# Patient Record
Sex: Female | Born: 1985 | Race: Black or African American | Hispanic: No | Marital: Married | Smoking: Never smoker
Health system: Southern US, Community
[De-identification: ages and names within clinical notes are randomized; demographics above are authoritative.]

## PROBLEM LIST (undated history)

## (undated) ENCOUNTER — Inpatient Hospital Stay (HOSPITAL_COMMUNITY): Payer: Self-pay

## (undated) DIAGNOSIS — Z789 Other specified health status: Secondary | ICD-10-CM

## (undated) DIAGNOSIS — O139 Gestational [pregnancy-induced] hypertension without significant proteinuria, unspecified trimester: Secondary | ICD-10-CM

## (undated) HISTORY — PX: NO PAST SURGERIES: SHX2092

## (undated) HISTORY — DX: Gestational (pregnancy-induced) hypertension without significant proteinuria, unspecified trimester: O13.9

---
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
## 2011-10-17 LAB — OB RESULTS CONSOLE GC/CHLAMYDIA: Gonorrhea: NEGATIVE

## 2011-10-17 LAB — OB RESULTS CONSOLE ABO/RH

## 2011-10-17 LAB — OB RESULTS CONSOLE HIV ANTIBODY (ROUTINE TESTING): HIV: NONREACTIVE

## 2011-10-17 LAB — OB RESULTS CONSOLE RPR: RPR: NONREACTIVE

## 2011-12-23 NOTE — L&D Delivery Note (Signed)
Delivery Note Pt progressed well on Pitocin and reached complete dilation at 2015pm.  She was noted to have some elevated BP in labor, but her PIH labs were normal and she had no proteinuria.  She developed a low grade temp of 99 at delivery.  At 9:16 PM a healthy female was delivered via Vaginal, Spontaneous Delivery (Presentation:ROA).  APGAR: 8, 9; weight pending .   Placenta status: Intact, Spontaneous.  Cord:  with the following complications: none .   Anesthesia: Epidural  Episiotomy: n/a Lacerations: none Suture Repair: n/a Est. Blood Loss (mL): 300cc  Mom to postpartum.  Baby to nursery-stable.  Oliver Pila 05/08/2012, 9:29 PM

## 2012-03-14 ENCOUNTER — Encounter (HOSPITAL_COMMUNITY): Payer: Self-pay | Admitting: Obstetrics and Gynecology

## 2012-03-14 ENCOUNTER — Inpatient Hospital Stay (HOSPITAL_COMMUNITY)
Admission: AD | Admit: 2012-03-14 | Discharge: 2012-03-14 | Disposition: A | Discharge status: Home / Self Care | Payer: Self-pay | Source: Ambulatory Visit | Attending: Obstetrics and Gynecology | Admitting: Obstetrics and Gynecology

## 2012-03-14 DIAGNOSIS — Z331 Pregnant state, incidental: Secondary | ICD-10-CM

## 2012-03-14 DIAGNOSIS — O99891 Other specified diseases and conditions complicating pregnancy: Secondary | ICD-10-CM | POA: Insufficient documentation

## 2012-03-14 DIAGNOSIS — O99340 Other mental disorders complicating pregnancy, unspecified trimester: Secondary | ICD-10-CM

## 2012-03-14 DIAGNOSIS — Z0000 Encounter for general adult medical examination without abnormal findings: Secondary | ICD-10-CM

## 2012-03-14 HISTORY — DX: Other specified health status: Z78.9

## 2012-03-14 LAB — CBC
HCT: 35.5 % — ABNORMAL LOW (ref 36.0–46.0)
Hemoglobin: 11.7 g/dL — ABNORMAL LOW (ref 12.0–15.0)
RBC: 4.08 MIL/uL (ref 3.87–5.11)
WBC: 11.7 10*3/uL — ABNORMAL HIGH (ref 4.0–10.5)

## 2012-03-14 LAB — URINALYSIS, ROUTINE W REFLEX MICROSCOPIC
Bilirubin Urine: NEGATIVE
Glucose, UA: NEGATIVE mg/dL
Ketones, ur: NEGATIVE mg/dL
pH: 7 (ref 5.0–8.0)

## 2012-03-14 LAB — URINE MICROSCOPIC-ADD ON

## 2012-03-14 NOTE — MAU Provider Note (Signed)
Chief Complaint:  Illness    First Provider Initiated Contact with Patient 03/14/12 1311      Joanne Gross is  26 y.o. G1P0000.  No LMP recorded. Patient is pregnant..  [redacted]w[redacted]d   She presents complaining of Illness . Onset is described as gradual and has been present for  3 days. Pt presents with vague complaint of "not feeling well" reports awoke this morning tired and not wanting to get out of bed. Denies abd pain, dysuria, nausea, vomiting, diarrhea, fever, chills, blding, LOF or URI s/s. Reports nothing to eat or drink today. +FM.   Obstetrical/Gynecological History: OB History    Grav Para Term Preterm Abortions TAB SAB Ect Mult Living   1 0 0 0 0 0 0 0 0 0       Past Medical History: Past Medical History  Diagnosis Date  . No pertinent past medical history     Past Surgical History: Past Surgical History  Procedure Date  . No past surgeries     Family History: No family history on file.  Social History: History  Substance Use Topics  . Smoking status: Never Smoker   . Smokeless tobacco: Not on file  . Alcohol Use: No    Allergies: No Known Allergies  No prescriptions prior to admission    Review of Systems - Negative except what has been reviewed in the HPI Psychological ROS: positive for - sleep disturbances negative for - behavioral disorder, obsessive thoughts or suicidal ideation  Physical Exam   Blood pressure 108/63, pulse 82, temperature 98.5 F (36.9 C), temperature source Oral, resp. rate 16.  General: General appearance - alert, well appearing, and in no distress, oriented to person, place, and time, overweight and laying on side, will not make eye contact Mental status - alert, oriented to person, place, and time, depressed mood, flat affect Abdomen - gravid, non tender FHT: 145, mod variability, + 15x15 accels, no decels, Cat I tracing TOco: Non contractions Focused Gynecological Exam: examination not indicated  Labs: Recent Results  (from the past 24 hour(s))  URINALYSIS, ROUTINE W REFLEX MICROSCOPIC   Collection Time   03/14/12 11:50 AM      Component Value Range   Color, Urine YELLOW  YELLOW    APPearance HAZY (*) CLEAR    Specific Gravity, Urine <1.005 (*) 1.005 - 1.030    pH 7.0  5.0 - 8.0    Glucose, UA NEGATIVE  NEGATIVE (mg/dL)   Hgb urine dipstick NEGATIVE  NEGATIVE    Bilirubin Urine NEGATIVE  NEGATIVE    Ketones, ur NEGATIVE  NEGATIVE (mg/dL)   Protein, ur NEGATIVE  NEGATIVE (mg/dL)   Urobilinogen, UA 0.2  0.0 - 1.0 (mg/dL)   Nitrite NEGATIVE  NEGATIVE    Leukocytes, UA SMALL (*) NEGATIVE   URINE MICROSCOPIC-ADD ON   Collection Time   03/14/12 11:50 AM      Component Value Range   Squamous Epithelial / LPF FEW (*) RARE    WBC, UA 7-10  <3 (WBC/hpf)   Bacteria, UA FEW (*) RARE   CBC   Collection Time   03/14/12  1:18 PM      Component Value Range   WBC 11.7 (*) 4.0 - 10.5 (K/uL)   RBC 4.08  3.87 - 5.11 (MIL/uL)   Hemoglobin 11.7 (*) 12.0 - 15.0 (g/dL)   HCT 16.1 (*) 09.6 - 46.0 (%)   MCV 87.0  78.0 - 100.0 (fL)   MCH 28.7  26.0 - 34.0 (pg)  MCHC 33.0  30.0 - 36.0 (g/dL)   RDW 40.9  81.1 - 91.4 (%)   Platelets 233  150 - 400 (K/uL)  GLUCOSE, CAPILLARY   Collection Time   03/14/12  1:20 PM      Component Value Range   Glucose-Capillary 74  70 - 99 (mg/dL)   MD Consult: Discussed with Dr. Senaida Ores  Assessment: Normal Exam at [redacted]w[redacted]d ? Depression  Plan: Discharge home Encouraged to eat and drink regularly  FU in office as scheduled or call for sooner appt if symptoms persist.  Livingston Denner E. 03/14/2012,1:47 PM

## 2012-03-14 NOTE — Discharge Instructions (Signed)
Suicidal Feelings, How to Help Yourself  Everyone feels sad or unhappy at times, but depressing thoughts and feelings of hopelessness can lead to thoughts of suicide. It can seem as if life is too tough to handle. It is as if the mountain is just too high and your climbing skills are not great enough. At that moment these dark thoughts and feelings may seem overwhelming and never ending. It is important to remember these feelings are temporary! They will go away. If you feel as though you have reached the point where suicide is the only answer, it is time to let someone know immediately. This is the first step to feeling better. The following steps will move you to safer ground and lead you in a positive direction out of depression.  HOW TO COPE AND PREVENT SUICIDE   Let family, friends, teachers and/or counselors know. Get help. Try not to isolate yourself from those who care about you. Even though you may not feel sociable or think that you are not good company, talk with someone everyday. It is best if it is face to face. Remember, they will want to help you.   Eat a regularly spaced and well-balanced diet, and get plenty of rest.   Avoid alcohol and drugs because they will only make you feel worse and may also lower your inhibitions. Remove them from the home. If you are thinking of taking an overdose of your prescribed medications, give your medicines to someone who can give them to you one day at a time. If you are on antidepressants, let your caregiver know of your feelings so he or she can provide a safer medication, if that is a concern.   Remove weapons or poisons from your home.   Try to stick to routines. That may mean just walking the dog or feeding the cat. Follow a schedule and remind yourself that you have to keep that schedule every day. Play with your pets. If it is possible, and you do not have a pet, get one. They give you a sense of well-being, lower your blood pressure and make your heart  feel good. They need you, and we all want to be needed.   Set some realistic goals and achieve them. Make a list and cross things off as you go. Accomplishments give a sense of worth. Wait until you are feeling better before doing things you find difficult or unpleasant to do.   If you are able, try to start exercising. Even half-hour periods of exercise each day will make you feel better. Getting out in the sun or into nature helps you recover from depression faster. If you have a favorite place to walk, take advantage of that.   Increase safe activities that have always given you pleasure. This may include playing your favorite music, reading a good book, painting a picture or playing your favorite instrument. Do whatever takes your mind off your depression and puts a smile on your face.   Keep your living space well lit with windows open, and let the sun shine in. Bright light definitely treats depression, not just people with the seasonal affective disorders (SAD).  Above all else remember, depression is temporary. It will go away. Do not contemplate suicide. Death as a permanent solution is not the answer. Suicide will take away the beautiful rest of life, and do lifelong harm to those around you who love you. Help is available.  National Suicide Help Lines with 24 hour help   are:  1-800-SUICIDE  1-800-784-2433  Document Released: 06/14/2003 Document Revised: 11/27/2011 Document Reviewed: 11/02/2007  ExitCare Patient Information 2012 [employer]  Depression, Adolescent and Adult  Depression is a true and treatable medical condition. In general there are two kinds of depression:   Depression we all experience in some form. For example depression from the death of a loved one, financial distress or natural disasters will trigger or increase depression.   Clinical depression, on the other hand, appears without an apparent cause or reason. This depression is a disease. Depression may be caused by chemical  imbalance in the body and brain or may come as a response to a physical illness. Alcohol and other drugs can cause depression.  DIAGNOSIS   The diagnosis of depression is usually based upon symptoms and medical history.  TREATMENT   Treatments for depression fall into three categories. These are:   Drug therapy. There are many medicines that treat depression. Responses may vary and sometimes trial and error is necessary to determine the best medicines and dosage for a particular patient.   Psychotherapy, also called talking treatments, helps people resolve their problems by looking at them from a different point of view and by giving people insight into their own personal makeup. Traditional psychotherapy looks at a childhood source of a problem. Other psychotherapy will look at current conflicts and move toward solving those. If the cause of depression is drug use, counseling is available to help abstain. In time the depression will usually improve. If there were underlying causes for the chemical use, they can be addressed.   ECT (electroconvulsive therapy) or shock treatment is not as commonly used today. It is a very effective treatment for severe suicidal depression. During ECT electrical impulses are applied to the head. These impulses cause a generalized seizure. It can be effective but causes a loss of memory for recent events. Sometimes this loss of memory may include the last several months.  Treat all depression or suicide threats as serious. Obtain professional help. Do not wait to see if serious depression will get better over time without help. Seek help for yourself or those around you.  In the [place] the number to the National Suicide Help Lines With 24 Hour Help Are:  1-800-SUICIDE  1-800-784-2433  Document Released: 12/05/2000 Document Revised: 11/27/2011 Document Reviewed: 07/26/2008  [employer] Patient Information 2012 [employer].

## 2012-04-19 LAB — OB RESULTS CONSOLE GBS: GBS: NEGATIVE

## 2012-05-03 ENCOUNTER — Telehealth (HOSPITAL_COMMUNITY): Payer: Self-pay | Admitting: *Deleted

## 2012-05-03 ENCOUNTER — Encounter (HOSPITAL_COMMUNITY): Payer: Self-pay | Admitting: *Deleted

## 2012-05-03 NOTE — Telephone Encounter (Signed)
Preadmission screen  

## 2012-05-04 ENCOUNTER — Encounter (HOSPITAL_COMMUNITY): Payer: Self-pay | Admitting: *Deleted

## 2012-05-04 ENCOUNTER — Telehealth (HOSPITAL_COMMUNITY): Payer: Self-pay | Admitting: *Deleted

## 2012-05-04 NOTE — Telephone Encounter (Signed)
Preadmission screen  

## 2012-05-08 ENCOUNTER — Inpatient Hospital Stay (HOSPITAL_COMMUNITY)
Admission: AD | Admit: 2012-05-08 | Discharge: 2012-05-10 | DRG: 774 | Disposition: A | Discharge status: Home / Self Care | Payer: No Typology Code available for payment source | Attending: Obstetrics and Gynecology | Admitting: Obstetrics and Gynecology

## 2012-05-08 ENCOUNTER — Encounter (HOSPITAL_COMMUNITY): Payer: Self-pay | Admitting: *Deleted

## 2012-05-08 ENCOUNTER — Inpatient Hospital Stay (HOSPITAL_COMMUNITY): Payer: No Typology Code available for payment source | Admitting: Anesthesiology

## 2012-05-08 ENCOUNTER — Encounter (HOSPITAL_COMMUNITY): Payer: Self-pay | Admitting: Anesthesiology

## 2012-05-08 DIAGNOSIS — O864 Pyrexia of unknown origin following delivery: Principal | ICD-10-CM | POA: Diagnosis not present

## 2012-05-08 LAB — COMPREHENSIVE METABOLIC PANEL
ALT: 17 U/L (ref 0–35)
AST: 23 U/L (ref 0–37)
Calcium: 9.6 mg/dL (ref 8.4–10.5)
GFR calc Af Amer: >90 mL/min (ref >90)
Glucose, Bld: 77 mg/dL (ref 70–99)
Sodium: 133 mEq/L — ABNORMAL LOW (ref 135–145)
Total Protein: 7 g/dL (ref 6.0–8.3)

## 2012-05-08 LAB — URINALYSIS, ROUTINE W REFLEX MICROSCOPIC
Bilirubin Urine: NEGATIVE
Leukocytes, UA: NEGATIVE
Nitrite: NEGATIVE
Specific Gravity, Urine: <1.005 — ABNORMAL LOW (ref 1.005–1.030)
pH: 6 (ref 5.0–8.0)

## 2012-05-08 LAB — CBC
MCH: 28.4 pg (ref 26.0–34.0)
MCHC: 32.8 g/dL (ref 30.0–36.0)
Platelets: 251 10*3/uL (ref 150–400)
RBC: 4.83 MIL/uL (ref 3.87–5.11)
RDW: 14.2 % (ref 11.5–15.5)

## 2012-05-08 LAB — RPR: RPR Ser Ql: NONREACTIVE

## 2012-05-08 MED ORDER — PHENYLEPHRINE 40 MCG/ML (10ML) SYRINGE FOR IV PUSH (FOR BLOOD PRESSURE SUPPORT): 80.0000 ug | PREFILLED_SYRINGE | INTRAVENOUS | Status: DC | PRN

## 2012-05-08 MED ORDER — FENTANYL 2.5 MCG/ML BUPIVACAINE 1/10 % EPIDURAL INFUSION (WH - ANES)
INTRAMUSCULAR | Status: DC | PRN
  Administered 2012-05-08: 14 mL/h via EPIDURAL

## 2012-05-08 MED ORDER — DIPHENHYDRAMINE HCL 50 MG/ML IJ SOLN: 12.5000 mg | INTRAMUSCULAR | Status: DC | PRN

## 2012-05-08 MED ORDER — EPHEDRINE 5 MG/ML INJ
10.0000 mg | INTRAVENOUS | Status: DC | PRN
  Filled 2012-05-08: qty 4

## 2012-05-08 MED ORDER — OXYCODONE-ACETAMINOPHEN 5-325 MG PO TABS: 1.0000 | ORAL_TABLET | ORAL | Status: DC | PRN

## 2012-05-08 MED ORDER — EPHEDRINE 5 MG/ML INJ: 10.0000 mg | INTRAVENOUS | Status: DC | PRN

## 2012-05-08 MED ORDER — OXYTOCIN BOLUS FROM INFUSION
500.0000 mL | Freq: Once | INTRAVENOUS | Status: AC
  Administered 2012-05-08: 500 mL via INTRAVENOUS
  Filled 2012-05-08: qty 500

## 2012-05-08 MED ORDER — ACETAMINOPHEN 325 MG PO TABS: 650.0000 mg | ORAL_TABLET | ORAL | Status: DC | PRN

## 2012-05-08 MED ORDER — FLEET ENEMA 7-19 GM/118ML RE ENEM: 1.0000 | ENEMA | RECTAL | Status: DC | PRN

## 2012-05-08 MED ORDER — LIDOCAINE HCL (PF) 1 % IJ SOLN
30.0000 mL | INTRAMUSCULAR | Status: DC | PRN
  Filled 2012-05-08: qty 30

## 2012-05-08 MED ORDER — IBUPROFEN 600 MG PO TABS
600.0000 mg | ORAL_TABLET | Freq: Four times a day (QID) | ORAL | Status: DC | PRN
  Administered 2012-05-08: 600 mg via ORAL
  Filled 2012-05-08: qty 1

## 2012-05-08 MED ORDER — BUTORPHANOL TARTRATE 2 MG/ML IJ SOLN: 1.0000 mg | INTRAMUSCULAR | Status: DC | PRN

## 2012-05-08 MED ORDER — CITRIC ACID-SODIUM CITRATE 334-500 MG/5ML PO SOLN: 30.0000 mL | ORAL | Status: DC | PRN

## 2012-05-08 MED ORDER — ONDANSETRON HCL 4 MG/2ML IJ SOLN: 4.0000 mg | Freq: Four times a day (QID) | INTRAMUSCULAR | Status: DC | PRN

## 2012-05-08 MED ORDER — LACTATED RINGERS IV SOLN: 500.0000 mL | INTRAVENOUS | Status: DC | PRN

## 2012-05-08 MED ORDER — OXYTOCIN 20 UNITS IN LACTATED RINGERS INFUSION - SIMPLE
125.0000 mL/h | Freq: Once | INTRAVENOUS | Status: DC
  Filled 2012-05-08: qty 1000

## 2012-05-08 MED ORDER — OXYTOCIN 20 UNITS IN LACTATED RINGERS INFUSION - SIMPLE
1.0000 m[IU]/min | INTRAVENOUS | Status: DC
  Administered 2012-05-08: 2 m[IU]/min via INTRAVENOUS

## 2012-05-08 MED ORDER — FENTANYL 2.5 MCG/ML BUPIVACAINE 1/10 % EPIDURAL INFUSION (WH - ANES)
14.0000 mL/h | INTRAMUSCULAR | Status: DC
  Administered 2012-05-08 (×2): 14 mL/h via EPIDURAL
  Filled 2012-05-08 (×4): qty 60

## 2012-05-08 MED ORDER — LACTATED RINGERS IV SOLN
INTRAVENOUS | Status: DC
  Administered 2012-05-08 (×3): via INTRAVENOUS

## 2012-05-08 MED ORDER — SODIUM BICARBONATE 8.4 % IV SOLN
INTRAVENOUS | Status: DC | PRN
  Administered 2012-05-08: 4 mL via EPIDURAL

## 2012-05-08 MED ORDER — TERBUTALINE SULFATE 1 MG/ML IJ SOLN: 0.2500 mg | Freq: Once | INTRAMUSCULAR | Status: DC | PRN

## 2012-05-08 MED ORDER — PHENYLEPHRINE 40 MCG/ML (10ML) SYRINGE FOR IV PUSH (FOR BLOOD PRESSURE SUPPORT)
80.0000 ug | PREFILLED_SYRINGE | INTRAVENOUS | Status: DC | PRN
  Filled 2012-05-08: qty 5

## 2012-05-08 MED ORDER — LACTATED RINGERS IV SOLN: 500.0000 mL | Freq: Once | INTRAVENOUS | Status: DC

## 2012-05-08 NOTE — Anesthesia Procedure Notes (Signed)

## 2012-05-08 NOTE — H&P (Signed)
Joanne Gross is a 26 y.o. female G1P0 at 39+ weeks presented to MAU delete with contractions and found to be 3cm dilated, walked and rechecked with change to 4cm and pain 8/10.  Admitted for pain management and will augment as needed.  Her prenatal course has been complicated only by several missed appts from 10-24 weeks but was consistent after that.  Had an abnormal one hour GTT and three hour had one abnormal value, pt declined repeat test at 32 weeks.  Blood type oneg, received rhogam.  Maternal Medical History:  Reason for admission: Reason for admission: contractions.  Contractions: Onset was 3-5 hours ago.   Frequency: regular.   Perceived severity is moderate.    Fetal activity: Perceived fetal activity is normal.      OB History    Grav Para Term Preterm Abortions TAB SAB Ect Mult Living   1 0 0 0 0 0 0 0 0 0      Past Medical History  Diagnosis Date  . No pertinent past medical history    Past Surgical History  Procedure Date  . No past surgeries    Family History: family history includes Diabetes in her maternal aunt and Hypertension in her mother. Social History:  reports that she has never smoked. She has never used smokeless tobacco. She reports that she does not drink alcohol or use illicit drugs.  ROS  Blood pressure 133/91, temperature 99.3 F (37.4 C), temperature source Oral, resp. rate 18, height 5\' 4"  (1.626 m), weight 80.377 kg (177 lb 3.2 oz). Exam Physical Exam  Constitutional: She is oriented to person, place, and time. She appears well-developed and well-nourished.  Cardiovascular: Normal rate and regular rhythm.   Respiratory: Effort normal and breath sounds normal.  GI: Soft. Bowel sounds are normal.  Genitourinary: Vagina normal and uterus normal.  Musculoskeletal: Normal range of motion.  Neurological: She is alert and oriented to person, place, and time.  Psychiatric: She has a normal mood and affect. Her behavior is normal.    Cervix 80/4/-1 AROM clear  Prenatal labs: ABO, Rh: O/Negative/-- (10/26 0000) Antibody: Negative (10/26 0000) Rubella: Immune (10/26 0000) RPR: Nonreactive (10/26 0000)  HBsAg: Negative (10/26 0000)  HIV: Non-reactive (10/26 0000)  GBS: Negative (04/29 0000)  One hour GCT 147 Three hour GTT 97/131/140/119 First trimester screen WNL, missed AFP Hgb AA  Assessment/Plan: Pt admitted and will now be augmented with pitocin as needed.  GBS negative.  Oliver Pila 05/08/2012, 11:10 AM

## 2012-05-08 NOTE — MAU Note (Signed)
Pt reports having ctx since 7am about 5 min. Reports having some bloody show repots not feeling baby move much this morning.

## 2012-05-08 NOTE — Anesthesia Preprocedure Evaluation (Signed)

## 2012-05-09 LAB — CBC
HCT: 35.8 % — ABNORMAL LOW (ref 36.0–46.0)
Hemoglobin: 11.7 g/dL — ABNORMAL LOW (ref 12.0–15.0)
MCH: 28.4 pg (ref 26.0–34.0)
MCV: 86.9 fL (ref 78.0–100.0)
Platelets: 245 10*3/uL (ref 150–400)
RBC: 4.12 MIL/uL (ref 3.87–5.11)

## 2012-05-09 MED ORDER — ONDANSETRON HCL 4 MG/2ML IJ SOLN: 4.0000 mg | INTRAMUSCULAR | Status: DC | PRN

## 2012-05-09 MED ORDER — OXYCODONE-ACETAMINOPHEN 5-325 MG PO TABS
1.0000 | ORAL_TABLET | ORAL | Status: DC | PRN
  Administered 2012-05-09: 1 via ORAL
  Filled 2012-05-09: qty 1

## 2012-05-09 MED ORDER — DIBUCAINE 1 % RE OINT: 1.0000 "application " | TOPICAL_OINTMENT | RECTAL | Status: DC | PRN

## 2012-05-09 MED ORDER — RHO D IMMUNE GLOBULIN 1500 UNIT/2ML IJ SOLN
300.0000 ug | Freq: Once | INTRAMUSCULAR | Status: AC
  Administered 2012-05-09: 300 ug via INTRAVENOUS
  Filled 2012-05-09: qty 2

## 2012-05-09 MED ORDER — BENZOCAINE-MENTHOL 20-0.5 % EX AERO
1.0000 "application " | INHALATION_SPRAY | CUTANEOUS | Status: DC | PRN
  Administered 2012-05-09: 1 via TOPICAL
  Filled 2012-05-09: qty 56

## 2012-05-09 MED ORDER — LANOLIN HYDROUS EX OINT: TOPICAL_OINTMENT | CUTANEOUS | Status: DC | PRN

## 2012-05-09 MED ORDER — SIMETHICONE 80 MG PO CHEW: 80.0000 mg | CHEWABLE_TABLET | ORAL | Status: DC | PRN

## 2012-05-09 MED ORDER — TETANUS-DIPHTH-ACELL PERTUSSIS 5-2.5-18.5 LF-MCG/0.5 IM SUSP
0.5000 mL | Freq: Once | INTRAMUSCULAR | Status: AC
  Administered 2012-05-09: 0.5 mL via INTRAMUSCULAR
  Filled 2012-05-09: qty 0.5

## 2012-05-09 MED ORDER — DIPHENHYDRAMINE HCL 25 MG PO CAPS: 25.0000 mg | ORAL_CAPSULE | Freq: Four times a day (QID) | ORAL | Status: DC | PRN

## 2012-05-09 MED ORDER — IBUPROFEN 600 MG PO TABS
600.0000 mg | ORAL_TABLET | Freq: Four times a day (QID) | ORAL | Status: DC
  Administered 2012-05-09 – 2012-05-10 (×5): 600 mg via ORAL
  Filled 2012-05-09 (×6): qty 1

## 2012-05-09 MED ORDER — SENNOSIDES-DOCUSATE SODIUM 8.6-50 MG PO TABS: 2.0000 | ORAL_TABLET | Freq: Every day | ORAL | Status: DC

## 2012-05-09 MED ORDER — WITCH HAZEL-GLYCERIN EX PADS: 1.0000 "application " | MEDICATED_PAD | CUTANEOUS | Status: DC | PRN

## 2012-05-09 MED ORDER — ONDANSETRON HCL 4 MG PO TABS: 4.0000 mg | ORAL_TABLET | ORAL | Status: DC | PRN

## 2012-05-09 MED ORDER — ZOLPIDEM TARTRATE 5 MG PO TABS: 5.0000 mg | ORAL_TABLET | Freq: Every evening | ORAL | Status: DC | PRN

## 2012-05-09 MED ORDER — PRENATAL MULTIVITAMIN CH
1.0000 | ORAL_TABLET | Freq: Every day | ORAL | Status: DC
  Administered 2012-05-09 – 2012-05-10 (×2): 1 via ORAL
  Filled 2012-05-09 (×2): qty 1

## 2012-05-09 NOTE — Progress Notes (Signed)
Post Partum Day 1 Subjective: no complaints, up ad lib and tolerating PO  Objective: Blood pressure 114/74, pulse 112, temperature 98.2 F (36.8 C), temperature source Oral, resp. rate 18, height 5\' 4"  (1.626 m), weight 80.377 kg (177 lb 3.2 oz), SpO2 98.00%, unknown if currently breastfeeding.  Physical Exam:  General: alert and cooperative Lochia: appropriate Uterine Fundus: firm    Basename 05/09/12 0637 05/08/12 1321  HGB 11.7* 13.7  HCT 35.8* 41.8    Assessment/Plan: Plan for discharge tomorrow BP WNL since delivery Low grade temp at delivery to 100, resolved   LOS: 1 day   Valree Feild W 05/09/2012, 10:23 AM

## 2012-05-09 NOTE — Anesthesia Postprocedure Evaluation (Signed)
  Anesthesia Post-op Note  Patient: Joanne Gross  Procedure(s) Performed: * No procedures listed *  Patient Location: PACU  Anesthesia Type: Epidural  Level of Consciousness: awake, alert  and oriented  Airway and Oxygen Therapy: Patient Spontanous Breathing  Post-op Pain: none  Post-op Assessment: Post-op Vital signs reviewed and Patient's Cardiovascular Status Stable  Post-op Vital Signs: Reviewed and stable  Complications: No apparent anesthesia complications

## 2012-05-10 LAB — RH IG WORKUP (INCLUDES ABO/RH)
Fetal Screen: NEGATIVE
Unit division: 0

## 2012-05-10 MED ORDER — IBUPROFEN 600 MG PO TABS: 600.0000 mg | ORAL_TABLET | Freq: Four times a day (QID) | ORAL | Status: AC

## 2012-05-10 MED ORDER — OXYCODONE-ACETAMINOPHEN 5-325 MG PO TABS: 1.0000 | ORAL_TABLET | ORAL | Status: AC | PRN

## 2012-05-10 NOTE — Progress Notes (Signed)
Post Partum Day 2 Subjective: no complaints, up ad lib and tolerating PO  Objective: Blood pressure 130/84, pulse 99, temperature 98.5 F (36.9 C), temperature source Oral, resp. rate 18, height 5\' 4"  (1.626 m), weight 80.377 kg (177 lb 3.2 oz), SpO2 98.00%, unknown if currently breastfeeding.  Physical Exam:  General: alert Lochia: appropriate Uterine Fundus: firm  Basename 05/09/12 0637 05/08/12 1321  HGB 11.7* 13.7  HCT 35.8* 41.8    Assessment/Plan: Discharge home Motrin and percocet F/u 6 weeks for postpartum exam   LOS: 2 days   Liahm Grivas W 05/10/2012, 6:04 AM

## 2012-05-10 NOTE — Discharge Summary (Signed)
Obstetric Discharge Summary Reason for Admission: onset of labor Prenatal Procedures: none Intrapartum Procedures: spontaneous vaginal delivery Postpartum Procedures: none Complications-Operative and Postpartum: none Hemoglobin  Date Value Range Status  05/09/2012 11.7* 12.0-15.0 (g/dL) Final     HCT  Date Value Range Status  05/09/2012 35.8* 36.0-46.0 (%) Final    Physical Exam:  General: alert and cooperative Lochia: appropriate Uterine Fundus: firm  Discharge Diagnoses: Term Pregnancy-delivered  Discharge Information: Date: 05/10/2012 Activity: pelvic rest Diet: routine Medications: Ibuprofen and Percocet Condition: improved Instructions: refer to practice specific booklet Discharge to: home   Newborn Data: Live born female  Birth Weight: 6 lb 9.1 oz (2980 g) APGAR: 8, 9  Home with mother.  Joanne Gross 05/10/2012, 6:02 AM

## 2012-05-10 NOTE — Progress Notes (Signed)
UR chart review completed.  

## 2012-05-13 ENCOUNTER — Inpatient Hospital Stay (HOSPITAL_COMMUNITY): Admission: RE | Admit: 2012-05-13 | Payer: Self-pay | Source: Ambulatory Visit

## 2012-05-13 ENCOUNTER — Ambulatory Visit (HOSPITAL_COMMUNITY)
Admission: RE | Admit: 2012-05-13 | Discharge: 2012-05-13 | Disposition: A | Discharge status: Home / Self Care | Payer: No Typology Code available for payment source | Source: Ambulatory Visit | Attending: Obstetrics and Gynecology | Admitting: Obstetrics and Gynecology

## 2012-05-13 NOTE — Progress Notes (Signed)
Adult Lactation Consultation Outpatient Visit Note  Patient Name: Joanne Gross                                Baby Girl Brielle, DOB [birth year], now 31 days old, Birth Weight 6 lb. 9 oz.  Date of Birth: 1986/09/07 Gestational Age at Delivery: [redacted]w[redacted]d Type of Delivery: SVB  Breastfeeding History: Frequency of Breastfeeding: Mom stopped breastfeeding 2 days ago due to sore, cracked and bleeding nipples Length of Feeding:  Voids: mom is unsure, estimates 4 per day Stools: 6-8 per day, yellow seedy  Supplementing / Method: Pumping:  Type of Pump:  Medela Pump N Style   Frequency:  Once today, yesterday 3 -4 times  Volume:  2 oz this am, Yesterday, she pumped 4 oz with first pump, then 2 oz, last was 1 oz.   Comments: Mom is here for assistance with latching her baby. She stopped breast feeding 2 days ago due to sore nipples. She reports the left nipple is cracked and bleeding. She has been pumping intermittently and supplementing the baby with EBM and formula with bottle.   Consultation Evaluation:  On exam, mom's breasts are very full but not engorged. The right nipple is red, the left nipple is cracked. Taught mom hand expression. Hand expression 1 ml from right breast to soften aerola to assist with latch. We started with the right breast that is not as sore. Assisted mom in football hold with obtaining a deep latch with the initial latch, and demonstrated to both parents how to bring bottom lip down. Mom reports pain with initial latch which improved with the baby nursing. Baby nursed for 15 minutes transferring 26 ml. On the left breast hand expressed again to soften aerola approx 1 ml., assisted mom to latch baby in football hold, baby nursed for 5 minutes transferring 8 ml. Mom reported pain throughout nursing regardless of good latch on the left breast. Changed position to cross cradle, mom reported pain with initial latch that improved with the baby nursing, Baby Brielle nursed for another  5 minutes transferring 12 ml. No bleeding visible from the left breast after the feeding, no evidence of positional stripes. Reviewed importance of good support and positioning to obtain deep latch.   Initial Feeding Assessment: Pre-feed Weight:  6 lb. 12.9 oz/3088 gm Post-feed Weight:  6 lb. 13.9 oz/3114 gm Amount Transferred:26 ml Comments:  Right breast with nursing 15 minutes  Additional Feeding Assessment: Pre-feed Weight:  6 lb. 13.9 oz/3114 gm Post-feed Weight:  6 lb. 14.1 oz/3122 gm Amount Transferred:  8 ml Comments:  Left breast with nursing for 5 minutes  Additional Feeding Assessment: Pre-feed Weight:  6 lb. 14.1 oz/3122 gm Post-feed Weight:  6 lb. 14.5oz/3134 gm Amount Transferred:  12 ml Comments:  Left breast with nursing an additional 5 minutes  Total Breast milk Transferred this Visit: 46 ml Total Supplement Given: None  Additional Interventions: Mom felt she could tolerate the baby at the breast. Discussed importance of breastfeeding every 2-3 hours or pumping to protect milk supply. Reviewed supply and demand. Care for sore nipples reviewed and hand out given to mom. Comfort gels given with instructions. Engorgement care instructions given to mom. With breastfeeding encouraged to start on the breast that is the least sore (right breast) till the left feels better. Alternate positions with breastfeeding. Let breast air dry after feeding. Signs of infection discussed with mom. Advised to  come to support group next week. Follow up appt. Made.   Follow-Up  Thursday, May 30th at 1:00    Alfred Levins 05/13/2012, 5:15 PM

## 2012-05-20 ENCOUNTER — Encounter (HOSPITAL_COMMUNITY): Payer: No Typology Code available for payment source

## 2012-09-22 ENCOUNTER — Ambulatory Visit (HOSPITAL_COMMUNITY)
Admission: RE | Admit: 2012-09-22 | Discharge: 2012-09-22 | Disposition: A | Discharge status: Home / Self Care | Payer: No Typology Code available for payment source | Source: Ambulatory Visit | Attending: Obstetrics and Gynecology | Admitting: Obstetrics and Gynecology

## 2012-09-22 NOTE — Progress Notes (Signed)
Infant Lactation Consultation Outpatient Visit Note  Patient Name: Joanne Gross Date of Birth: 14-May-1986 Birth Weight:   Gestational Age at Delivery: Gestational Age: <None> Type of Delivery:   Breastfeeding History Frequency of Breastfeeding: Baby now 63 months old and is mostly bottle feeding EBM. Mom wants to get baby back to breastfeeding Length of Feeding:  Voids: 5-6 Stools: 1 every 4 days  Supplementing / Method: Pumping:  Type of Pump: Has own pump   Frequency:q 3 hours  Volume:  3-4 oz  Comments: Mom is able to meet baby's needs- is not receiving any formula    Consultation Evaluation:  Initial Feeding Assessment: Pre-feed Weight: 14- 0.3  6360g Post-feed Weight: Amount Transferred: Comments: Attempted to nurse baby. She would not latch on and got fussy. Easily distracted. Encouraged mom to express a few drops of milk so baby could get a taste of it. Still would not latch and got fussier. Tried with SNS so baby would get more at the beginning of the feeding. Still would not latch. Fed 1 oz EBM by bottle to calm her. Still would not latch. Mom decided to bottle fed her EBM. Baby very calm and looking all around. Still would not latch. Encouraged mom to try to latch when baby was not very hungry. Encouraged her to do some nuzzling with the baby so she could get the feel of the breast and maybe would latch. Encouraged to try at every feeding to latch baby to breast- when she is not working. She has been trying to latch baby once a day. More practice at the breast may help. No questions at present.Mom states she feels comfortable with the SNS. May also want to try pumping for a few minutes so the milk is flowing and then try to latch. Wants to make another OP appointment for next week. Made for next Wednesday at 10:30am.  Additional Feeding Assessment: Pre-feed Weight: Post-feed Weight: Amount Transferred: Comments:  Additional Feeding Assessment: Pre-feed  Weight: Post-feed Weight: Amount Transferred: Comments:  Total Breast milk Transferred this Visit:  Total Supplement Given:   Additional Interventions:   Follow-Up OP appointment here Wed 09/29/12 at 10:30     Pamelia Hoit 09/22/2012, 9:31 AM

## 2012-09-29 ENCOUNTER — Ambulatory Visit (HOSPITAL_COMMUNITY)
Admission: RE | Admit: 2012-09-29 | Discharge: 2012-09-29 | Disposition: A | Discharge status: Home / Self Care | Payer: No Typology Code available for payment source | Source: Ambulatory Visit | Attending: Obstetrics and Gynecology | Admitting: Obstetrics and Gynecology

## 2012-09-29 NOTE — Progress Notes (Signed)
Adult Lactation Consultation Outpatient Visit Note  Patient Name: ROSAN CALBERT       BABY: Angus Palms Date of Birth: 02/28/1986                    DOB: [birth year] Gestational Age at Delivery: term      BIRTH WEIGHT: 6-9 Type of Delivery:                                 WEIGHT TODAY: 14-4.7  Breastfeeding History: Frequency of Breastfeeding: ATTEMPTS ONLY Length of Feeding:  Voids: QS Stools: QS  Supplementing / Method: EBM 3-4 OZ. EVERY 2-3 HOURS Pumping:  Type of Pump:PUMP IN STYLE   Frequency:EVERY 3 HOURS  Volume: 3-4 OZ   Comments:    Consultation Evaluation:Mom here with 60 month old baby to follow up from last weeks outpatient appointment.  Mom shares history that baby was latching and breastfeeding for the first 2 months but then when she returned to work and received more bottles she began refusing the breast.  Mom has continued to attempt this past week but baby immediately becomes frantic and pulls away crying.  Discussed with mom that using a nipple shield filled with expressed milk may be helpful because it will feel more like the bottle.  Attempted this but baby arches and screams when positioning baby at breast.  No latch or sucking achieved.  Mom then tried to give baby EBM in bottle and baby continued to fight bottle and cry even though she was due to eat.  Mom states baby has a cold and clear thick drainage noted from nose.  Mom states baby has been afebrile but fussy since yesterday.  Mom plans to call pediatrician if she does not improve or runs an elevated temperature.  Mom is very motivated to get baby back to breast.  We discussed taking the nipple shield with her and when baby starts to feel better attempting to latch baby at different times in the day.  Mom requests another appointment in one week.  Initial Feeding Assessment:BABY REFUSED TO LATCH Pre-feed Weight: Post-feed Weight: Amount Transferred: Comments:  Additional Feeding Assessment: Pre-feed  Weight: Post-feed Weight: Amount Transferred: Comments:  Additional Feeding Assessment: Pre-feed Weight: Post-feed Weight: Amount Transferred: Comments:  Total Breast milk Transferred this Visit:  Total Supplement Given:   Additional Interventions:   Follow-Up  OUTPATIENT APPOINTMENT 10/06/12 0900      Hansel Feinstein 09/29/2012, 11:55 AM

## 2012-10-06 ENCOUNTER — Ambulatory Visit (HOSPITAL_COMMUNITY): Payer: No Typology Code available for payment source

## 2012-10-11 ENCOUNTER — Ambulatory Visit (HOSPITAL_COMMUNITY): Admission: RE | Admit: 2012-10-11 | Payer: No Typology Code available for payment source | Source: Ambulatory Visit

## 2014-10-23 ENCOUNTER — Encounter (HOSPITAL_COMMUNITY): Payer: Self-pay | Admitting: *Deleted

## 2015-12-23 NOTE — L&D Delivery Note (Signed)
Delivery Note At 8:34 AM a viable female was delivered via Vaginal, Spontaneous Delivery (Presentation: ; Right Occiput Anterior).  APGAR: 8, 9; weight pending at time of note. Infant placed directly on mom's abdomen for bonding/skin-to-skin. Delayed cord clamping, then cord clamped x 2, and cut by fob.     Placenta status: Intact, Spontaneous.  Cord: 3 vessels with the following complications: None.  Cord pH: not done Delivery by Adria DevonKara, [employer] Med Student- supervised by me  Anesthesia: Epidural  Episiotomy: None Lacerations: None Suture Repair: n/a Est. Blood Loss (mL): 200  Mom to postpartum.  Baby to Couplet care / Skin to Skin. Plans to breastfeed, undecided about contraception, outpatient circumcision  Marge DuncansBooker, Jeric Slagel Randall 05/03/2016, 9:24 AM

## 2016-02-13 ENCOUNTER — Ambulatory Visit (INDEPENDENT_AMBULATORY_CARE_PROVIDER_SITE_OTHER): Payer: Medicaid Other | Admitting: General Practice

## 2016-02-13 DIAGNOSIS — Z3201 Encounter for pregnancy test, result positive: Secondary | ICD-10-CM

## 2016-02-13 DIAGNOSIS — O0933 Supervision of pregnancy with insufficient antenatal care, third trimester: Secondary | ICD-10-CM

## 2016-02-13 LAB — POCT PREGNANCY, URINE: PREG TEST UR: POSITIVE — AB

## 2016-02-13 NOTE — Progress Notes (Signed)
Patient here for pregnancy test today. upt positive. First positive home test 9/16. LMP 08/03/15 EDD 05/09/16 [redacted]w[redacted]d. Patient wants to start care here & desires lab work today. Anatomy ultrasound schedule for 2/27 patient informed. Patient to make new OB appt today. New ob packet & 28 week packets given. Patient had no questions. Encouraged patient to start PNV.

## 2016-02-14 LAB — PRENATAL PROFILE (SOLSTAS)
ANTIBODY SCREEN: NEGATIVE
BASOS ABS: 0 10*3/uL (ref 0.0–0.1)
BASOS PCT: 0 % (ref 0–1)
EOS ABS: 0.1 10*3/uL (ref 0.0–0.7)
Eosinophils Relative: 1 % (ref 0–5)
HCT: 35.6 % — ABNORMAL LOW (ref 36.0–46.0)
HEMOGLOBIN: 12.1 g/dL (ref 12.0–15.0)
HIV 1&2 Ab, 4th Generation: NONREACTIVE
Hepatitis B Surface Ag: NEGATIVE
LYMPHS PCT: 19 % (ref 12–46)
Lymphs Abs: 1.9 10*3/uL (ref 0.7–4.0)
MCH: 28.7 pg (ref 26.0–34.0)
MCHC: 34 g/dL (ref 30.0–36.0)
MCV: 84.6 fL (ref 78.0–100.0)
MONO ABS: 0.5 10*3/uL (ref 0.1–1.0)
MPV: 9.7 fL (ref 8.6–12.4)
Monocytes Relative: 5 % (ref 3–12)
NEUTROS ABS: 7.4 10*3/uL (ref 1.7–7.7)
NEUTROS PCT: 75 % (ref 43–77)
Platelets: 249 10*3/uL (ref 150–400)
RBC: 4.21 MIL/uL (ref 3.87–5.11)
RDW: 14.4 % (ref 11.5–15.5)
RH TYPE: NEGATIVE
Rubella: 7.05 Index — ABNORMAL HIGH (ref <0.90)
WBC: 9.9 10*3/uL (ref 4.0–10.5)

## 2016-02-14 LAB — GLUCOSE TOLERANCE, 1 HOUR (50G) W/O FASTING: Glucose, 1 Hr, gestational: 124 mg/dL (ref <140)

## 2016-02-15 LAB — PRESCRIPTION MONITORING PROFILE (19 PANEL)
AMPHETAMINE/METH: NEGATIVE ng/mL
BARBITURATE SCREEN, URINE: NEGATIVE ng/mL
BUPRENORPHINE, URINE: NEGATIVE ng/mL
Benzodiazepine Screen, Urine: NEGATIVE ng/mL
CANNABINOID SCRN UR: NEGATIVE ng/mL
Carisoprodol, Urine: NEGATIVE ng/mL
Cocaine Metabolites: NEGATIVE ng/mL
Creatinine, Urine: 35.48 mg/dL (ref >20.0)
Fentanyl, Ur: NEGATIVE ng/mL
MDMA URINE: NEGATIVE ng/mL
MEPERIDINE UR: NEGATIVE ng/mL
METHADONE SCREEN, URINE: NEGATIVE ng/mL
METHAQUALONE SCREEN (URINE): NEGATIVE ng/mL
Nitrites, Initial: NEGATIVE ug/mL
Opiate Screen, Urine: NEGATIVE ng/mL
Oxycodone Screen, Ur: NEGATIVE ng/mL
PHENCYCLIDINE, UR: NEGATIVE ng/mL
Propoxyphene: NEGATIVE ng/mL
TAPENTADOLUR: NEGATIVE ng/mL
Tramadol Scrn, Ur: NEGATIVE ng/mL
Zolpidem, Urine: NEGATIVE ng/mL
pH, Initial: 6.4 pH (ref 4.5–8.9)

## 2016-02-15 LAB — HEMOGLOBINOPATHY EVALUATION
HEMOGLOBIN OTHER: 0 %
HGB A: 97.2 % (ref 96.8–97.8)
HGB F QUANT: 0 % (ref 0.0–2.0)
HGB S QUANTITAION: 0 %
Hgb A2 Quant: 2.8 % (ref 2.2–3.2)

## 2016-02-15 LAB — CULTURE, OB URINE
COLONY COUNT: NO GROWTH
ORGANISM ID, BACTERIA: NO GROWTH

## 2016-02-18 ENCOUNTER — Other Ambulatory Visit: Payer: Self-pay | Admitting: General Practice

## 2016-02-18 ENCOUNTER — Ambulatory Visit (HOSPITAL_COMMUNITY)
Admission: RE | Admit: 2016-02-18 | Discharge: 2016-02-18 | Disposition: A | Discharge status: Home / Self Care | Payer: Medicaid Other | Source: Ambulatory Visit | Attending: Obstetrics and Gynecology | Admitting: Obstetrics and Gynecology

## 2016-02-18 ENCOUNTER — Encounter (HOSPITAL_COMMUNITY): Payer: Self-pay

## 2016-02-18 DIAGNOSIS — O0933 Supervision of pregnancy with insufficient antenatal care, third trimester: Secondary | ICD-10-CM | POA: Insufficient documentation

## 2016-02-18 DIAGNOSIS — Z3A28 28 weeks gestation of pregnancy: Secondary | ICD-10-CM | POA: Diagnosis not present

## 2016-02-18 DIAGNOSIS — Z3689 Encounter for other specified antenatal screening: Secondary | ICD-10-CM

## 2016-02-18 DIAGNOSIS — Z36 Encounter for antenatal screening of mother: Secondary | ICD-10-CM | POA: Diagnosis present

## 2016-02-20 ENCOUNTER — Encounter: Payer: Self-pay | Admitting: Obstetrics and Gynecology

## 2016-02-20 DIAGNOSIS — O26893 Other specified pregnancy related conditions, third trimester: Secondary | ICD-10-CM

## 2016-02-20 DIAGNOSIS — Z3480 Encounter for supervision of other normal pregnancy, unspecified trimester: Secondary | ICD-10-CM | POA: Insufficient documentation

## 2016-02-20 DIAGNOSIS — Z6791 Unspecified blood type, Rh negative: Secondary | ICD-10-CM | POA: Insufficient documentation

## 2016-03-06 ENCOUNTER — Encounter: Payer: Self-pay | Admitting: Family

## 2016-03-06 ENCOUNTER — Ambulatory Visit (INDEPENDENT_AMBULATORY_CARE_PROVIDER_SITE_OTHER): Payer: Medicaid Other | Admitting: Family

## 2016-03-06 ENCOUNTER — Other Ambulatory Visit (HOSPITAL_COMMUNITY)
Admission: RE | Admit: 2016-03-06 | Discharge: 2016-03-06 | Disposition: A | Discharge status: Home / Self Care | Payer: Medicaid Other | Source: Ambulatory Visit | Attending: Obstetrics and Gynecology | Admitting: Obstetrics and Gynecology

## 2016-03-06 VITALS — BP 135/86 | HR 102 | Temp 98.5°F | Wt 181.1 lb

## 2016-03-06 DIAGNOSIS — O360130 Maternal care for anti-D [Rh] antibodies, third trimester, not applicable or unspecified: Secondary | ICD-10-CM

## 2016-03-06 DIAGNOSIS — Z01419 Encounter for gynecological examination (general) (routine) without abnormal findings: Secondary | ICD-10-CM | POA: Insufficient documentation

## 2016-03-06 DIAGNOSIS — O0930 Supervision of pregnancy with insufficient antenatal care, unspecified trimester: Secondary | ICD-10-CM | POA: Insufficient documentation

## 2016-03-06 DIAGNOSIS — Z3493 Encounter for supervision of normal pregnancy, unspecified, third trimester: Secondary | ICD-10-CM

## 2016-03-06 DIAGNOSIS — Z23 Encounter for immunization: Secondary | ICD-10-CM | POA: Diagnosis not present

## 2016-03-06 DIAGNOSIS — O0933 Supervision of pregnancy with insufficient antenatal care, third trimester: Secondary | ICD-10-CM | POA: Diagnosis not present

## 2016-03-06 DIAGNOSIS — Z3483 Encounter for supervision of other normal pregnancy, third trimester: Secondary | ICD-10-CM

## 2016-03-06 LAB — POCT URINALYSIS DIP (DEVICE)
BILIRUBIN URINE: NEGATIVE
GLUCOSE, UA: NEGATIVE mg/dL
Hgb urine dipstick: NEGATIVE
Ketones, ur: NEGATIVE mg/dL
NITRITE: NEGATIVE
Protein, ur: NEGATIVE mg/dL
Specific Gravity, Urine: 1.015 (ref 1.005–1.030)
UROBILINOGEN UA: 0.2 mg/dL (ref 0.0–1.0)
pH: 7 (ref 5.0–8.0)

## 2016-03-06 MED ORDER — RHO D IMMUNE GLOBULIN 1500 UNIT/1.3ML IJ SOLN: 1500.0000 [IU] | Freq: Once | INTRAMUSCULAR | Status: DC

## 2016-03-06 MED ORDER — RHO D IMMUNE GLOBULIN 1500 UNIT/2ML IJ SOSY
300.0000 ug | PREFILLED_SYRINGE | Freq: Once | INTRAMUSCULAR | Status: AC
  Administered 2016-03-06: 300 ug via INTRAMUSCULAR

## 2016-03-06 NOTE — Progress Notes (Signed)
   Subjective:    Joanne Gross is a G2P1001 4317w6d being seen today for her first obstetrical visit.  Recently moved from [place], separated from husband.  Lots of family members in [place].  Reports doing well.  Her obstetrical history is significant for late prenatal care. Patient does intend to breast feed. Pregnancy history fully reviewed.  Patient reports increased mucus in AM, other than that no additonal concerns.  Filed Vitals:   03/06/16 0825  BP: 135/86  Pulse: 102  Temp: 98.5 F (36.9 C)  Weight: 181 lb 1.6 oz (82.146 kg)    HISTORY: OB History  Gravida Para Term Preterm AB SAB TAB Ectopic Multiple Living  2 1 1  0 0 0 0 0 0 1    # Outcome Date GA Lbr Len/2nd Weight Sex Delivery Anes PTL Lv  2 Current           1 Term 05/08/12 3033w3d 13:15 / 01:01 6 lb 9.1 oz (2.98 kg) F Vag-Spont EPI  Y     Past Medical History  Diagnosis Date  . No pertinent past medical history    Past Surgical History  Procedure Laterality Date  . No past surgeries    . Vaginal delivery  04/2012   Family History  Problem Relation Age of Onset  . Hypertension Mother   . Diabetes Maternal Aunt      Exam   BP 135/86 mmHg  Pulse 102  Temp(Src) 98.5 F (36.9 C)  Wt 181 lb 1.6 oz (82.146 kg)  LMP 08/03/2015 (Exact Date) Uterine Size: size equals dates  Pelvic Exam:    Perineum: No Hemorrhoids, Normal Perineum   Vulva: normal   Vagina:  normal mucosa, normal discharge, no palpable nodules   pH: Not done   Cervix: Spotting following Pap, no cervical motion tenderness and no lesions   Adnexa: normal adnexa and no mass, fullness, tenderness   Bony Pelvis: Adequate  System: Breast:  No nipple retraction or dimpling, No nipple discharge or bleeding, No axillary or supraclavicular adenopathy, Normal to palpation without dominant masses   Skin: normal coloration and turgor, no rashes    Neurologic: negative   Extremities: normal strength, tone, and muscle mass   HEENT neck supple  with midline trachea and thyroid without masses   Mouth/Teeth mucous membranes moist, pharynx normal without lesions   Neck supple and no masses   Cardiovascular: regular rate and rhythm, no murmurs or gallops   Respiratory:  appears well, vitals normal, no respiratory distress, acyanotic, normal RR, neck free of mass or lymphadenopathy, chest clear, no wheezing, crepitations, rhonchi, normal symmetric air entry   Abdomen: soft, non-tender; bowel sounds normal; no masses,  no organomegaly   Urinary: urethral meatus normal      Assessment:    Pregnancy: G2P1001 Patient Active Problem List   Diagnosis Date Noted  . Supervision of other normal pregnancy, antepartum 02/20/2016  . Rh negative, antepartum 02/20/2016     Plan:     Initial labs drawn. Pap smear collected. Received Tdap and Rhogam Prenatal vitamins. Problem list reviewed and updated. Genetic Screening:  Too late Ultrasound discussed; fetal survey: results reviewed. Follow up in 2 weeks.  Marlis EdelsonKARIM, Willard Madrigal N 03/06/2016

## 2016-03-06 NOTE — Patient Instructions (Addendum)
Third Trimester of Pregnancy The third trimester is from week 29 through week 42, months 7 through 9. The third trimester is a time when the fetus is growing rapidly. At the end of the ninth month, the fetus is about 20 inches in length and weighs 6-10 pounds.  BODY CHANGES Your body goes through many changes during pregnancy. The changes vary from woman to woman.   Your weight will continue to increase. You can expect to gain 25-35 pounds (11-16 kg) by the end of the pregnancy.  You may begin to get stretch marks on your hips, abdomen, and breasts.  You may urinate more often because the fetus is moving lower into your pelvis and pressing on your bladder.  You may develop or continue to have heartburn as a result of your pregnancy.  You may develop constipation because certain hormones are causing the muscles that push waste through your intestines to slow down.  You may develop hemorrhoids or swollen, bulging veins (varicose veins).  You may have pelvic pain because of the weight gain and pregnancy hormones relaxing your joints between the bones in your pelvis. Backaches may result from overexertion of the muscles supporting your posture.  You may have changes in your hair. These can include thickening of your hair, rapid growth, and changes in texture. Some women also have hair loss during or after pregnancy, or hair that feels dry or thin. Your hair will most likely return to normal after your baby is born.  Your breasts will continue to grow and be tender. A yellow discharge may leak from your breasts called colostrum.  Your belly button may stick out.  You may feel short of breath because of your expanding uterus.  You may notice the fetus "dropping," or moving lower in your abdomen.  You may have a bloody mucus discharge. This usually occurs a few days to a week before labor begins.  Your cervix becomes thin and soft (effaced) near your due date. WHAT TO EXPECT AT YOUR  PRENATAL EXAMS  You will have prenatal exams every 2 weeks until week 36. Then, you will have weekly prenatal exams. During a routine prenatal visit:  You will be weighed to make sure you and the fetus are growing normally.  Your blood pressure is taken.  Your abdomen will be measured to track your baby's growth.  The fetal heartbeat will be listened to.  Any test results from the previous visit will be discussed.  You may have a cervical check near your due date to see if you have effaced. At around 36 weeks, your caregiver will check your cervix. At the same time, your caregiver will also perform a test on the secretions of the vaginal tissue. This test is to determine if a type of bacteria, Group B streptococcus, is present. Your caregiver will explain this further. Your caregiver may ask you:  What your birth plan is.  How you are feeling.  If you are feeling the baby move.  If you have had any abnormal symptoms, such as leaking fluid, bleeding, severe headaches, or abdominal cramping.  If you are using any tobacco products, including cigarettes, chewing tobacco, and electronic cigarettes.  If you have any questions. Other tests or screenings that may be performed during your third trimester include:  Blood tests that check for low iron levels (anemia).  Fetal testing to check the health, activity level, and growth of the fetus. Testing is done if you have certain medical conditions or if   there are problems during the pregnancy.  HIV (human immunodeficiency virus) testing. If you are at high risk, you may be screened for HIV during your third trimester of pregnancy. FALSE LABOR You may feel small, irregular contractions that eventually go away. These are called Braxton Hicks contractions, or false labor. Contractions may last for hours, days, or even weeks before true labor sets in. If contractions come at regular intervals, intensify, or become painful, it is best to be seen  by your caregiver.  SIGNS OF LABOR   Menstrual-like cramps.  Contractions that are 5 minutes apart or less.  Contractions that start on the top of the uterus and spread down to the lower abdomen and back.  A sense of increased pelvic pressure or back pain.  A watery or bloody mucus discharge that comes from the vagina. If you have any of these signs before the 37th week of pregnancy, call your caregiver right away. You need to go to the hospital to get checked immediately. HOME CARE INSTRUCTIONS   Avoid all smoking, herbs, alcohol, and unprescribed drugs. These chemicals affect the formation and growth of the baby.  Do not use any tobacco products, including cigarettes, chewing tobacco, and electronic cigarettes. If you need help quitting, ask your health care provider. You may receive counseling support and other resources to help you quit.  Follow your caregiver's instructions regarding medicine use. There are medicines that are either safe or unsafe to take during pregnancy.  Exercise only as directed by your caregiver. Experiencing uterine cramps is a good sign to stop exercising.  Continue to eat regular, healthy meals.  Wear a good support bra for breast tenderness.  Do not use hot tubs, steam rooms, or saunas.  Wear your seat belt at all times when driving.  Avoid raw meat, uncooked cheese, cat litter boxes, and soil used by cats. These carry germs that can cause birth defects in the baby.  Take your prenatal vitamins.  Take 1500-2000 mg of calcium daily starting at the 20th week of pregnancy until you deliver your baby.  Try taking a stool softener (if your caregiver approves) if you develop constipation. Eat more high-fiber foods, such as fresh vegetables or fruit and whole grains. Drink plenty of fluids to keep your urine clear or pale yellow.  Take warm sitz baths to soothe any pain or discomfort caused by hemorrhoids. Use hemorrhoid cream if your caregiver  approves.  If you develop varicose veins, wear support hose. Elevate your feet for 15 minutes, 3-4 times a day. Limit salt in your diet.  Avoid heavy lifting, wear low heal shoes, and practice good posture.  Rest a lot with your legs elevated if you have leg cramps or low back pain.  Visit your dentist if you have not gone during your pregnancy. Use a soft toothbrush to brush your teeth and be gentle when you floss.  A sexual relationship may be continued unless your caregiver directs you otherwise.  Do not travel far distances unless it is absolutely necessary and only with the approval of your caregiver.  Take prenatal classes to understand, practice, and ask questions about the labor and delivery.  Make a trial run to the hospital.  Pack your hospital bag.  Prepare the baby's nursery.  Continue to go to all your prenatal visits as directed by your caregiver. SEEK MEDICAL CARE IF:  You are unsure if you are in labor or if your water has broken.  You have dizziness.  You have   mild pelvic cramps, pelvic pressure, or nagging pain in your abdominal area.  You have persistent nausea, vomiting, or diarrhea.  You have a bad smelling vaginal discharge.  You have pain with urination. SEEK IMMEDIATE MEDICAL CARE IF:   You have a fever.  You are leaking fluid from your vagina.  You have spotting or bleeding from your vagina.  You have severe abdominal cramping or pain.  You have rapid weight loss or gain.  You have shortness of breath with chest pain.  You notice sudden or extreme swelling of your face, hands, ankles, feet, or legs.  You have not felt your baby move in over an hour.  You have severe headaches that do not go away with medicine.  You have vision changes.   This information is not intended to replace advice given to you by your health care provider. Make sure you discuss any questions you have with your health care provider.   Document Released:  12/02/2001 Document Revised: 12/29/2014 Document Reviewed: 02/08/2013 Elsevier Interactive Patient Education 2016 Elsevier Inc.  AREA PEDIATRIC/FAMILY PRACTICE PHYSICIANS  [facility] [address] [place], [place] [address] Phone - [phone]   Fax - [phone]  [facility] [address] [place], [place]  [address] Phone - [phone]   Fax - [phone]  [facility] [address] [place], [place]  [address] Phone - [phone]   Fax - [phone]  [facility] [address] [place], [place]  [address] Phone - [phone]   Fax - [phone]  [facility] [address] [place], [place]  [address] Phone - [phone]   Fax - [phone]  [facility] [address] [place], [place]  [address] Phone - [phone]   Fax - [phone]  [facility] [address] [place], [place]  [address] Phone - [phone]   Fax - [phone]  [facility] [address] [place], [place]  [address] Phone - [phone]   Fax - [phone]  [facility] [address] [place], [place]  [address] Phone - [phone]   Fax - [phone] [facility] [address] [place], [place]  [address] Phone - [phone]   Fax - [phone]  [facility] [address] [place], [place]  [address] Phone - [phone]   Fax - [phone]  [facility] [address] [place], [place]  [address] Phone - [phone]   Fax - [phone]  [facility] [address] [place], [place]  [address] Phone - [phone]   Fax - [phone]  SHILPA GOSRANI [address] [place], [place]  [address] Phone - [phone]  [facility] [address] [place], [place]  [address] Phone - [phone]   Fax - [phone]  [facility] [address] [place], [place]  [address] Phone - [phone]   Fax - [phone]  [facility] [address] [place], [place]  [address] Phone - [phone]   Fax - [phone]  [facility] [address] [place], [place]  [address] Phone - [phone]   Fax - [phone]   [facility] [address] , [place]  [address] Phone - [phone]   Fax - [phone]  [facility] [address] [place], [place]  [address] Phone - [phone]   Fax - [phone]  DAVID RUBIN [address] [place], [place]  [address] Phone - [phone]   Fax - [phone]  [facility] [address] [place], [place]  [address] Phone - [phone]   Fax - [phone]  [facility] [address] [place] [place], [place]  [address] Phone - [phone]   Fax - [phone] [facility] [address] [place], [place]  [address] Phone - [phone]   Fax - [phone]  [facility] [address] [place], [place]  [address] Phone - [phone]   Fax - [phone]  [facility] [address] [place], [place]  [address] Phone - [phone]   Fax - [phone]

## 2016-03-07 LAB — CYTOLOGY - PAP

## 2016-03-18 ENCOUNTER — Ambulatory Visit (INDEPENDENT_AMBULATORY_CARE_PROVIDER_SITE_OTHER): Payer: Medicaid Other | Admitting: Family

## 2016-03-18 VITALS — BP 137/81 | HR 102 | Temp 98.5°F | Wt 182.6 lb

## 2016-03-18 DIAGNOSIS — Z3483 Encounter for supervision of other normal pregnancy, third trimester: Secondary | ICD-10-CM

## 2016-03-18 LAB — POCT URINALYSIS DIP (DEVICE)
BILIRUBIN URINE: NEGATIVE
Glucose, UA: NEGATIVE mg/dL
HGB URINE DIPSTICK: NEGATIVE
Ketones, ur: NEGATIVE mg/dL
NITRITE: NEGATIVE
PH: 7 (ref 5.0–8.0)
Protein, ur: NEGATIVE mg/dL
Specific Gravity, Urine: 1.02 (ref 1.005–1.030)
UROBILINOGEN UA: 0.2 mg/dL (ref 0.0–1.0)

## 2016-03-18 NOTE — Patient Instructions (Signed)
LEG CRAMPS - Leg cramps are common, usually occurring during the latter half of pregnancy. The cramps are due to painful muscle contractions and are generally experienced in the calves at night. They are thought to be secondary to a buildup of lactic and pyruvic acids leading to involuntary contraction of the affected muscles, but the exact etiology is unknown.  The preparation used was magnesium lactate or citrate 5 mmol in the morning and 10 mmol in the evening.    Stretching exercises may be an effective preventive measure. These can be performed in the weight-bearing position; they are held for 20 seconds and repeated three times in succession, four times daily for one week, then twice daily thereafter.  If a cramp occurs, calf stretches (toe raises), walking, or leg jiggling followed by leg elevation may be helpful. Other nonpharmacologic remedies include: ?A hot shower or warm tub bath ?Ice massage ?Regular exercise for conditioning, calf strengthening and stretching ?Increased hydration ?Use of long-countered shoes and other proper foot gear.  

## 2016-03-18 NOTE — Progress Notes (Signed)
Subjective:  Joanne Gross is a 30 y.o. G2P1001 at 5934w4d being seen today for ongoing prenatal care.  She is currently monitored for the following issues for this low-risk pregnancy and has Supervision of other normal pregnancy, antepartum; Rh negative, antepartum; and Late prenatal care affecting pregnancy, antepartum on her problem list.  Patient reports no complaints.  Desires to take prenatal yoga.   Contractions: Irritability. Vag. Bleeding: None.  Movement: Present. Denies leaking of fluid.   The following portions of the patient's history were reviewed and updated as appropriate: allergies, current medications, past family history, past medical history, past social history, past surgical history and problem list. Problem list updated.  Objective:   Filed Vitals:   03/18/16 1116  BP: 137/81  Pulse: 102  Temp: 98.5 F (36.9 C)  Weight: 182 lb 9.6 oz (82.827 kg)    Fetal Status: Fetal Heart Rate (bpm): 155 Fundal Height: 33 cm Movement: Present     General:  Alert, oriented and cooperative. Patient is in no acute distress.  Skin: Skin is warm and dry. No rash noted.   Cardiovascular: Normal heart rate noted  Respiratory: Normal respiratory effort, no problems with respiration noted  Abdomen: Soft, gravid, appropriate for gestational age. Pain/Pressure: Present     Pelvic: Vag. Bleeding: None     Cervical exam deferred        Extremities: Normal range of motion.  Edema: Trace  Mental Status: Normal mood and affect. Normal behavior. Normal judgment and thought content.   Urinalysis: Urine Protein: Negative Urine Glucose: Negative  Assessment and Plan:  Pregnancy: G2P1001 at 2134w4d  1. Supervision of other normal pregnancy, antepartum, third trimester - Letter given to participate in prenatal yoga  Preterm labor symptoms and general obstetric precautions including but not limited to vaginal bleeding, contractions, leaking of fluid and fetal movement were reviewed in detail  with the patient. Please refer to After Visit Summary for other counseling recommendations.  Return in about 2 weeks (around 04/01/2016).   Eino FarberWalidah Kennith GainN Gross, CNM

## 2016-03-18 NOTE — Progress Notes (Signed)
Breastfeeding tip of the week reviewed. 

## 2016-04-08 ENCOUNTER — Ambulatory Visit (INDEPENDENT_AMBULATORY_CARE_PROVIDER_SITE_OTHER): Payer: Medicaid Other | Admitting: Certified Nurse Midwife

## 2016-04-08 VITALS — BP 132/83 | HR 96 | Wt 187.5 lb

## 2016-04-08 DIAGNOSIS — Z3483 Encounter for supervision of other normal pregnancy, third trimester: Secondary | ICD-10-CM | POA: Diagnosis not present

## 2016-04-08 LAB — POCT URINALYSIS DIP (DEVICE)
Bilirubin Urine: NEGATIVE
Glucose, UA: NEGATIVE mg/dL
Hgb urine dipstick: NEGATIVE
Ketones, ur: NEGATIVE mg/dL
Nitrite: NEGATIVE
Protein, ur: NEGATIVE mg/dL
Specific Gravity, Urine: 1.015 (ref 1.005–1.030)
Urobilinogen, UA: 0.2 mg/dL (ref 0.0–1.0)
pH: 7 (ref 5.0–8.0)

## 2016-04-08 NOTE — Progress Notes (Signed)
Pt thinks she may have reflux

## 2016-04-15 ENCOUNTER — Ambulatory Visit (INDEPENDENT_AMBULATORY_CARE_PROVIDER_SITE_OTHER): Payer: Medicaid Other | Admitting: Certified Nurse Midwife

## 2016-04-15 ENCOUNTER — Other Ambulatory Visit (HOSPITAL_COMMUNITY)
Admission: RE | Admit: 2016-04-15 | Discharge: 2016-04-15 | Disposition: A | Discharge status: Home / Self Care | Payer: Medicaid Other | Source: Ambulatory Visit | Attending: Certified Nurse Midwife | Admitting: Certified Nurse Midwife

## 2016-04-15 VITALS — BP 129/80 | HR 86 | Wt 190.0 lb

## 2016-04-15 DIAGNOSIS — Z113 Encounter for screening for infections with a predominantly sexual mode of transmission: Secondary | ICD-10-CM | POA: Diagnosis present

## 2016-04-15 DIAGNOSIS — Z3483 Encounter for supervision of other normal pregnancy, third trimester: Secondary | ICD-10-CM

## 2016-04-15 DIAGNOSIS — O0933 Supervision of pregnancy with insufficient antenatal care, third trimester: Secondary | ICD-10-CM

## 2016-04-15 LAB — POCT URINALYSIS DIP (DEVICE)
Bilirubin Urine: NEGATIVE
Glucose, UA: NEGATIVE mg/dL
Hgb urine dipstick: NEGATIVE
Ketones, ur: NEGATIVE mg/dL
Nitrite: NEGATIVE
Protein, ur: NEGATIVE mg/dL
Specific Gravity, Urine: 1.025 (ref 1.005–1.030)
Urobilinogen, UA: 0.2 mg/dL (ref 0.0–1.0)
pH: 7 (ref 5.0–8.0)

## 2016-04-15 LAB — OB RESULTS CONSOLE GC/CHLAMYDIA: Gonorrhea: NEGATIVE

## 2016-04-15 LAB — OB RESULTS CONSOLE GBS: GBS: NEGATIVE

## 2016-04-15 NOTE — Patient Instructions (Signed)
Group B streptococcus (GBS) is a type of bacteria often found in healthy women. GBS is not the same as the bacteria that causes strep throat. You may have GBS in your vagina, rectum, or bladder. GBS does not spread through sexual contact, but it can be passed to a baby during childbirth. This can be dangerous for your baby. It is not dangerous to you and usually does not cause any symptoms. Your health care provider may test you for GBS when your pregnancy is between 35 and 37 weeks. GBS is dangerous only during birth, so there is no need to test for it earlier. It is possible to have GBS during pregnancy and never pass it to your baby. If your test results are positive for GBS, your health care provider may recommend giving you antibiotic medicine during delivery to make sure your baby stays healthy. RISK FACTORS You are more likely to pass GBS to your baby if:   Your water breaks (ruptured membrane) or you go into labor before 37 weeks.  Your water breaks 18 hours before you deliver.  You passed GBS during a previous pregnancy.  You have a urinary tract infection caused by GBS any time during pregnancy.  You have a fever during labor. SYMPTOMS Most women who have GBS do not have any symptoms. If you have a urinary tract infection caused by GBS, you might have frequent or painful urination and fever. Babies who get GBS usually show symptoms within 7 days of birth. Symptoms may include:   Breathing problems.  Heart and blood pressure problems.  Digestive and kidney problems. DIAGNOSIS Routine screening for GBS is recommended for all pregnant women. A health care provider takes a sample of the fluid in your vagina and rectum with a swab. It is then sent to a lab to be checked for GBS. A sample of your urine may also be checked for the bacteria.  TREATMENT If you test positive for GBS, you may need treatment with an antibiotic medicine during labor. As soon as you go into labor, or as soon as  your membranes rupture, you will get the antibiotic medicine through an IV access. You will continue to get the medicine until after you give birth. You do not need antibiotic medicine if you are having a cesarean delivery.If your baby shows signs or symptoms of GBS after birth, your baby can also be treated with an antibiotic medicine. HOME CARE INSTRUCTIONS   Take all antibiotic medicine as prescribed by your health care provider. Only take medicine as directed.   Continue with prenatal visits and care.   Keep all follow-up appointments.  SEEK MEDICAL CARE IF:   You have pain when you urinate.   You have to urinate frequently.   You have a fever.  SEEK IMMEDIATE MEDICAL CARE IF:   Your membranes rupture.  You go into labor.   This information is not intended to replace advice given to you by your health care provider. Make sure you discuss any questions you have with your health care provider.   Document Released: 03/16/2008 Document Revised: 12/13/2013 Document Reviewed: 09/30/2013 Elsevier Interactive Patient Education 2016 Elsevier Inc.  

## 2016-04-15 NOTE — Progress Notes (Signed)
Subjective:  Joanne Gross is a 30 y.o. G2P1001 at 3971w4d being seen today for ongoing prenatal care.  She is currently monitored for the following issues for this low-risk pregnancy and has Supervision of other normal pregnancy, antepartum; Rh negative, antepartum; and Late prenatal care affecting pregnancy, antepartum on her problem list.  Patient reports no complaints.  Contractions: Irritability. Vag. Bleeding: None.  Movement: Present. Denies leaking of fluid.   The following portions of the patient's history were reviewed and updated as appropriate: allergies, current medications, past family history, past medical history, past social history, past surgical history and problem list. Problem list updated.  Objective:   Filed Vitals:   04/15/16 1416  BP: 129/80  Pulse: 86  Weight: 190 lb (86.183 kg)    Fetal Status: Fetal Heart Rate (bpm): 146 Fundal Height: 37 cm Movement: Present  Presentation: Vertex  General:  Alert, oriented and cooperative. Patient is in no acute distress.  Skin: Skin is warm and dry. No rash noted.   Cardiovascular: Normal heart rate noted  Respiratory: Normal respiratory effort, no problems with respiration noted  Abdomen: Soft, gravid, appropriate for gestational age. Pain/Pressure: Absent     Pelvic: Vag. Bleeding: None     Cervical exam performed Dilation: Closed Effacement (%): Thick Station: -3  Extremities: Normal range of motion.  Edema: Trace  Mental Status: Normal mood and affect. Normal behavior. Normal judgment and thought content.   Urinalysis: Urine Protein: Negative Urine Glucose: Negative  Assessment and Plan:  Pregnancy: G2P1001 at 4871w4d  1. Late prenatal care affecting pregnancy, antepartum, third trimester  - Culture, beta strep (group b only) - GC[facility]Chlamydia probe amp [facility]  2. Supervision of other normal pregnancy, antepartum, third trimester   Preterm labor symptoms and general obstetric precautions  including but not limited to vaginal bleeding, contractions, leaking of fluid and fetal movement were reviewed in detail with the patient. Please refer to After Visit Summary for other counseling recommendations.  Return in about 1 week (around 04/22/2016).   Rhea PinkLori A Tabita Corbo, CNM

## 2016-04-16 LAB — GC/CHLAMYDIA PROBE AMP (~~LOC~~) NOT AT ARMC
Chlamydia: NEGATIVE
Neisseria Gonorrhea: NEGATIVE

## 2016-04-17 LAB — CULTURE, BETA STREP (GROUP B ONLY)

## 2016-04-23 ENCOUNTER — Ambulatory Visit (INDEPENDENT_AMBULATORY_CARE_PROVIDER_SITE_OTHER): Payer: Medicaid Other | Admitting: Advanced Practice Midwife

## 2016-04-23 VITALS — BP 122/87 | HR 91 | Wt 187.2 lb

## 2016-04-23 DIAGNOSIS — O0933 Supervision of pregnancy with insufficient antenatal care, third trimester: Secondary | ICD-10-CM

## 2016-04-23 LAB — POCT URINALYSIS DIP (DEVICE)
BILIRUBIN URINE: NEGATIVE
GLUCOSE, UA: NEGATIVE mg/dL
HGB URINE DIPSTICK: NEGATIVE
Ketones, ur: NEGATIVE mg/dL
NITRITE: NEGATIVE
Protein, ur: 30 mg/dL — AB
SPECIFIC GRAVITY, URINE: 1.015 (ref 1.005–1.030)
UROBILINOGEN UA: 0.2 mg/dL (ref 0.0–1.0)
pH: 6.5 (ref 5.0–8.0)

## 2016-04-23 NOTE — Patient Instructions (Signed)

## 2016-04-23 NOTE — Progress Notes (Signed)
Subjective:  Joanne SentersChristina M Gross is a 30 y.o. G2P1001 at 6190w5d being seen today for ongoing prenatal care.  She is currently monitored for the following issues for this low-risk pregnancy and has Supervision of other normal pregnancy, antepartum; Rh negative, antepartum; and Late prenatal care affecting pregnancy, antepartum on her problem list.  Patient reports no complaints.  Contractions: Irritability. Vag. Bleeding: None.  Movement: Present. Denies leaking of fluid.   The following portions of the patient's history were reviewed and updated as appropriate: allergies, current medications, past family history, past medical history, past social history, past surgical history and problem list. Problem list updated.  Objective:   Filed Vitals:   04/23/16 1038  BP: 122/87  Pulse: 91  Weight: 187 lb 3.2 oz (84.913 kg)    Fetal Status: Fetal Heart Rate (bpm): 147 Fundal Height: 38 cm Movement: Present     General:  Alert, oriented and cooperative. Patient is in no acute distress.  Skin: Skin is warm and dry. No rash noted.   Cardiovascular: Normal heart rate noted  Respiratory: Normal respiratory effort, no problems with respiration noted  Abdomen: Soft, gravid, appropriate for gestational age. Pain/Pressure: Present     Pelvic: Vag. Bleeding: None     Cervical exam deferred        Extremities: Normal range of motion.  Edema: Trace  Mental Status: Normal mood and affect. Normal behavior. Normal judgment and thought content.   Urinalysis: Urine Protein: 1+ Urine Glucose: Negative  Assessment and Plan:  Pregnancy: G2P1001 at 4090w5d  1. Late prenatal care affecting pregnancy, antepartum, third trimester   Term labor symptoms and general obstetric precautions including but not limited to vaginal bleeding, contractions, leaking of fluid and fetal movement were reviewed in detail with the patient. Please refer to After Visit Summary for other counseling recommendations.  Return in about 1  week (around 04/30/2016).   Hurshel PartyLisa A Leftwich-Kirby, CNM

## 2016-04-30 ENCOUNTER — Ambulatory Visit (INDEPENDENT_AMBULATORY_CARE_PROVIDER_SITE_OTHER): Payer: Medicaid Other | Admitting: Advanced Practice Midwife

## 2016-04-30 VITALS — BP 137/90 | HR 90 | Wt 189.0 lb

## 2016-04-30 DIAGNOSIS — Z3483 Encounter for supervision of other normal pregnancy, third trimester: Secondary | ICD-10-CM

## 2016-04-30 DIAGNOSIS — O163 Unspecified maternal hypertension, third trimester: Secondary | ICD-10-CM

## 2016-04-30 LAB — POCT URINALYSIS DIP (DEVICE)
BILIRUBIN URINE: NEGATIVE
Glucose, UA: NEGATIVE mg/dL
HGB URINE DIPSTICK: NEGATIVE
Ketones, ur: NEGATIVE mg/dL
NITRITE: NEGATIVE
PH: 7 (ref 5.0–8.0)
PROTEIN: NEGATIVE mg/dL
Specific Gravity, Urine: 1.01 (ref 1.005–1.030)
UROBILINOGEN UA: 0.2 mg/dL (ref 0.0–1.0)

## 2016-04-30 LAB — COMPREHENSIVE METABOLIC PANEL
ALK PHOS: 125 U/L — AB (ref 33–115)
ALT: 25 U/L (ref 6–29)
AST: 33 U/L — AB (ref 10–30)
Albumin: 3.4 g/dL — ABNORMAL LOW (ref 3.6–5.1)
BILIRUBIN TOTAL: 0.2 mg/dL (ref 0.2–1.2)
BUN: 8 mg/dL (ref 7–25)
CO2: 22 mmol/L (ref 20–31)
Calcium: 9.5 mg/dL (ref 8.6–10.2)
Chloride: 101 mmol/L (ref 98–110)
Creat: 0.74 mg/dL (ref 0.50–1.10)
GLUCOSE: 77 mg/dL (ref 65–99)
Potassium: 4.8 mmol/L (ref 3.5–5.3)
Sodium: 135 mmol/L (ref 135–146)
Total Protein: 6.3 g/dL (ref 6.1–8.1)

## 2016-05-01 ENCOUNTER — Telehealth: Payer: Self-pay | Admitting: Advanced Practice Midwife

## 2016-05-01 LAB — CBC
HEMATOCRIT: 39.1 % (ref 35.0–45.0)
HEMOGLOBIN: 13 g/dL (ref 11.7–15.5)
MCH: 27.9 pg (ref 27.0–33.0)
MCHC: 33.2 g/dL (ref 32.0–36.0)
MCV: 83.9 fL (ref 80.0–100.0)
MPV: 10.2 fL (ref 7.5–12.5)
Platelets: 257 10*3/uL (ref 140–400)
RBC: 4.66 MIL/uL (ref 3.80–5.10)
RDW: 15.1 % — AB (ref 11.0–15.0)
WBC: 7.8 10*3/uL (ref 3.8–10.8)

## 2016-05-01 LAB — PROTEIN / CREATININE RATIO, URINE
Creatinine, Urine: 53 mg/dL (ref 20–320)
Protein Creatinine Ratio: 132 mg/g creat (ref 21–161)
Total Protein, Urine: 7 mg/dL (ref 5–24)

## 2016-05-01 NOTE — Telephone Encounter (Signed)
Called pt to inform her of normal preeclampsia labs in clinic yesterday.  Recommend BP check tomorrow, scheduled at 11:00 in clinic.  Discussed with pt recommendation of IOL at 39 weeks (tomorrow) if BP remains elevated.  Pt states understanding.

## 2016-05-01 NOTE — Progress Notes (Signed)
Subjective:  Joanne Gross is a [age] y.o. G2P1001 at 7319w6d being seen today for ongoing prenatal care.  She is currently monitored for the following issues for this low-risk pregnancy and has Supervision of other normal pregnancy, antepartum; Rh negative, antepartum; and Late prenatal care affecting pregnancy, antepartum on her problem list.  Patient reports no complaints.  Contractions: Irritability. Vag. Bleeding: None.  Movement: Present. Denies leaking of fluid.   The following portions of the patient's history were reviewed and updated as appropriate: allergies, current medications, past family history, past medical history, past social history, past surgical history and problem list. Problem list updated.  Objective:   Filed Vitals:   04/30/16 1019  BP: 137/90  Pulse: 90  Weight: 189 lb (85.73 kg)    Fetal Status: Fetal Heart Rate (bpm): 149 Fundal Height: 39 cm Movement: Present     General:  Alert, oriented and cooperative. Patient is in no acute distress.  Skin: Skin is warm and dry. No rash noted.   Cardiovascular: Normal heart rate noted  Respiratory: Normal respiratory effort, no problems with respiration noted  Abdomen: Soft, gravid, appropriate for gestational age. Pain/Pressure: Present     Pelvic: Vag. Bleeding: None     Cervical exam deferred        Extremities: Normal range of motion.  Edema: Trace  Mental Status: Normal mood and affect. Normal behavior. Normal judgment and thought content.   Urinalysis: Urine Protein: Negative Urine Glucose: Negative  Assessment and Plan:  Pregnancy: G2P1001 at 4819w6d 1. Supervision of other normal pregnancy, antepartum, third trimester   2. Hypertension in pregnancy, antepartum, third trimester  - CBC - Protein / creatinine ratio, urine - Comprehensive metabolic panel --BP check in 2-3 days  Term labor symptoms and general obstetric precautions including but not limited to vaginal bleeding, contractions, leaking of fluid  and fetal movement were reviewed in detail with the patient. Please refer to After Visit Summary for other counseling recommendations.  No Follow-up on file.   Hurshel PartyLisa A Leftwich-Kirby, CNM

## 2016-05-02 ENCOUNTER — Ambulatory Visit: Payer: Medicaid Other

## 2016-05-02 ENCOUNTER — Inpatient Hospital Stay (HOSPITAL_COMMUNITY)
Admission: AD | Admit: 2016-05-02 | Discharge: 2016-05-04 | DRG: 775 | Disposition: A | Discharge status: Home / Self Care | Payer: Medicaid Other | Source: Ambulatory Visit | Attending: Obstetrics & Gynecology | Admitting: Obstetrics & Gynecology

## 2016-05-02 ENCOUNTER — Inpatient Hospital Stay (HOSPITAL_COMMUNITY): Payer: Medicaid Other | Admitting: Anesthesiology

## 2016-05-02 ENCOUNTER — Encounter (HOSPITAL_COMMUNITY): Payer: Self-pay | Admitting: General Surgery

## 2016-05-02 VITALS — BP 152/96 | HR 94

## 2016-05-02 DIAGNOSIS — Z8249 Family history of ischemic heart disease and other diseases of the circulatory system: Secondary | ICD-10-CM | POA: Diagnosis not present

## 2016-05-02 DIAGNOSIS — Z79899 Other long term (current) drug therapy: Secondary | ICD-10-CM | POA: Diagnosis not present

## 2016-05-02 DIAGNOSIS — O134 Gestational [pregnancy-induced] hypertension without significant proteinuria, complicating childbirth: Principal | ICD-10-CM | POA: Diagnosis present

## 2016-05-02 DIAGNOSIS — Z833 Family history of diabetes mellitus: Secondary | ICD-10-CM

## 2016-05-02 DIAGNOSIS — Z3A39 39 weeks gestation of pregnancy: Secondary | ICD-10-CM | POA: Diagnosis not present

## 2016-05-02 DIAGNOSIS — O1494 Unspecified pre-eclampsia, complicating childbirth: Secondary | ICD-10-CM

## 2016-05-02 DIAGNOSIS — O1490 Unspecified pre-eclampsia, unspecified trimester: Secondary | ICD-10-CM | POA: Diagnosis present

## 2016-05-02 DIAGNOSIS — O26893 Other specified pregnancy related conditions, third trimester: Secondary | ICD-10-CM

## 2016-05-02 DIAGNOSIS — O0930 Supervision of pregnancy with insufficient antenatal care, unspecified trimester: Secondary | ICD-10-CM

## 2016-05-02 DIAGNOSIS — Z0130 Encounter for examination of blood pressure without abnormal findings: Secondary | ICD-10-CM

## 2016-05-02 DIAGNOSIS — Z6791 Unspecified blood type, Rh negative: Secondary | ICD-10-CM | POA: Diagnosis present

## 2016-05-02 DIAGNOSIS — Z3480 Encounter for supervision of other normal pregnancy, unspecified trimester: Secondary | ICD-10-CM

## 2016-05-02 DIAGNOSIS — O1493 Unspecified pre-eclampsia, third trimester: Secondary | ICD-10-CM | POA: Diagnosis not present

## 2016-05-02 LAB — COMPREHENSIVE METABOLIC PANEL
ALT: 31 U/L (ref 14–54)
ANION GAP: 11 (ref 5–15)
AST: 36 U/L (ref 15–41)
Albumin: 3.3 g/dL — ABNORMAL LOW (ref 3.5–5.0)
Alkaline Phosphatase: 131 U/L — ABNORMAL HIGH (ref 38–126)
BUN: 11 mg/dL (ref 6–20)
CHLORIDE: 104 mmol/L (ref 101–111)
CO2: 20 mmol/L — ABNORMAL LOW (ref 22–32)
Calcium: 9.5 mg/dL (ref 8.9–10.3)
Creatinine, Ser: 0.77 mg/dL (ref 0.44–1.00)
GFR calc non Af Amer: >60 mL/min (ref >60)
Glucose, Bld: 82 mg/dL (ref 65–99)
Potassium: 4.4 mmol/L (ref 3.5–5.1)
SODIUM: 135 mmol/L (ref 135–145)
TOTAL PROTEIN: 6.7 g/dL (ref 6.5–8.1)
Total Bilirubin: 0.6 mg/dL (ref 0.3–1.2)

## 2016-05-02 LAB — PROTEIN / CREATININE RATIO, URINE
CREATININE, URINE: 94 mg/dL
Protein Creatinine Ratio: 0.2 mg/mg{Cre} — ABNORMAL HIGH (ref 0.00–0.15)
TOTAL PROTEIN, URINE: 19 mg/dL

## 2016-05-02 LAB — CBC
HEMATOCRIT: 38 % (ref 36.0–46.0)
Hemoglobin: 13.2 g/dL (ref 12.0–15.0)
MCH: 29.1 pg (ref 26.0–34.0)
MCHC: 34.7 g/dL (ref 30.0–36.0)
MCV: 83.7 fL (ref 78.0–100.0)
Platelets: 288 10*3/uL (ref 150–400)
RBC: 4.54 MIL/uL (ref 3.87–5.11)
RDW: 13.9 % (ref 11.5–15.5)
WBC: 9.1 10*3/uL (ref 4.0–10.5)

## 2016-05-02 MED ORDER — EPHEDRINE 5 MG/ML INJ
10.0000 mg | INTRAVENOUS | Status: DC | PRN
  Filled 2016-05-02: qty 2

## 2016-05-02 MED ORDER — FENTANYL 2.5 MCG/ML BUPIVACAINE 1/10 % EPIDURAL INFUSION (WH - ANES)
14.0000 mL/h | INTRAMUSCULAR | Status: DC | PRN
  Administered 2016-05-02 – 2016-05-03 (×3): 14 mL/h via EPIDURAL
  Filled 2016-05-02 (×2): qty 125

## 2016-05-02 MED ORDER — OXYCODONE-ACETAMINOPHEN 5-325 MG PO TABS: 2.0000 | ORAL_TABLET | ORAL | Status: DC | PRN

## 2016-05-02 MED ORDER — DIPHENHYDRAMINE HCL 50 MG/ML IJ SOLN: 12.5000 mg | INTRAMUSCULAR | Status: DC | PRN

## 2016-05-02 MED ORDER — OXYCODONE-ACETAMINOPHEN 5-325 MG PO TABS: 1.0000 | ORAL_TABLET | ORAL | Status: DC | PRN

## 2016-05-02 MED ORDER — OXYTOCIN BOLUS FROM INFUSION
500.0000 mL | INTRAVENOUS | Status: DC
  Administered 2016-05-03: 500 mL via INTRAVENOUS

## 2016-05-02 MED ORDER — OXYTOCIN 40 UNITS IN LACTATED RINGERS INFUSION - SIMPLE MED
1.0000 m[IU]/min | INTRAVENOUS | Status: DC
  Administered 2016-05-02: 2 m[IU]/min via INTRAVENOUS
  Filled 2016-05-02: qty 1000

## 2016-05-02 MED ORDER — ACETAMINOPHEN 325 MG PO TABS: 650.0000 mg | ORAL_TABLET | ORAL | Status: DC | PRN

## 2016-05-02 MED ORDER — ONDANSETRON HCL 4 MG/2ML IJ SOLN: 4.0000 mg | Freq: Four times a day (QID) | INTRAMUSCULAR | Status: DC | PRN

## 2016-05-02 MED ORDER — CITRIC ACID-SODIUM CITRATE 334-500 MG/5ML PO SOLN: 30.0000 mL | ORAL | Status: DC | PRN

## 2016-05-02 MED ORDER — MISOPROSTOL 25 MCG QUARTER TABLET
25.0000 ug | ORAL_TABLET | ORAL | Status: DC | PRN
  Administered 2016-05-02: 25 ug via VAGINAL
  Filled 2016-05-02: qty 0.25
  Filled 2016-05-02: qty 1

## 2016-05-02 MED ORDER — LACTATED RINGERS IV SOLN: 500.0000 mL | INTRAVENOUS | Status: DC | PRN

## 2016-05-02 MED ORDER — OXYTOCIN 40 UNITS IN LACTATED RINGERS INFUSION - SIMPLE MED: 2.5000 [IU]/h | INTRAVENOUS | Status: DC

## 2016-05-02 MED ORDER — TERBUTALINE SULFATE 1 MG/ML IJ SOLN
0.2500 mg | Freq: Once | INTRAMUSCULAR | Status: DC | PRN
  Filled 2016-05-02: qty 1

## 2016-05-02 MED ORDER — LACTATED RINGERS IV SOLN
500.0000 mL | Freq: Once | INTRAVENOUS | Status: AC
  Administered 2016-05-02: 500 mL via INTRAVENOUS

## 2016-05-02 MED ORDER — LACTATED RINGERS IV SOLN
INTRAVENOUS | Status: DC
  Administered 2016-05-02: 500 mL via INTRAVENOUS
  Administered 2016-05-02 (×2): via INTRAVENOUS

## 2016-05-02 MED ORDER — PHENYLEPHRINE 40 MCG/ML (10ML) SYRINGE FOR IV PUSH (FOR BLOOD PRESSURE SUPPORT)
80.0000 ug | PREFILLED_SYRINGE | INTRAVENOUS | Status: DC | PRN
  Filled 2016-05-02: qty 5
  Filled 2016-05-02: qty 10

## 2016-05-02 MED ORDER — LIDOCAINE HCL (PF) 1 % IJ SOLN
30.0000 mL | INTRAMUSCULAR | Status: DC | PRN
  Filled 2016-05-02: qty 30

## 2016-05-02 MED ORDER — PHENYLEPHRINE 40 MCG/ML (10ML) SYRINGE FOR IV PUSH (FOR BLOOD PRESSURE SUPPORT)
80.0000 ug | PREFILLED_SYRINGE | INTRAVENOUS | Status: DC | PRN
  Filled 2016-05-02: qty 5

## 2016-05-02 MED ORDER — LIDOCAINE HCL (PF) 1 % IJ SOLN
INTRAMUSCULAR | Status: DC | PRN
  Administered 2016-05-02 (×2): 6 mL

## 2016-05-02 MED ORDER — LACTATED RINGERS IV SOLN: 500.0000 mL | Freq: Once | INTRAVENOUS | Status: DC

## 2016-05-02 NOTE — H&P (Signed)
Subjective:  Joanne SentersChristina M Gross is a 30 y.o. G2 P1 female at 3439 and 0/[redacted] weeks gestation by LMP who is being admitted for induction of labor.  Her current obstetrical history is significant for hypertension at clinic checkup this morning; she denies hypertension at prior visits during the pregnancy.  Patient reports occasional contractions.   Fetal Movement: normal.     Objective:   Vital signs in last 24 hours: Temp:  [98.4 F (36.9 C)] 98.4 F (36.9 C) (05/12 1652) Pulse Rate:  [94-97] 97 (05/12 1652) Resp:  [17] 17 (05/12 1652) BP: (142-152)/(92-96) 142/92 mmHg (05/12 1652) SpO2:  [98 %] 98 % (05/12 1652) Weight:  [85.73 kg (189 lb)] 85.73 kg (189 lb) (05/12 1707)   General:   alert and cooperative  Skin:   normal  HEENT:  PERRLA  Lungs:   clear to auscultation bilaterally  Heart:   regular rate and rhythm, S1, S2 normal, no murmur, click, rub or gallop  Breasts:   not performed  Abdomen:  soft, non-tender; bowel sounds normal; no masses,  no organomegaly  Pelvis:  Exam deferred.  FHT:  147 BPM  Uterine Size: size equals dates  Presentations: cephalic  Cervix:    Dilation: 2cm   Effacement: Long   Station:  -3   Consistency: soft   Position: anterior   Lab Review  GBS negative Blood type: O Neg, antibody status currently unknown  AFP:NML  One hour GTT: Normal    Assessment/Plan:  39 and 0/[redacted] weeks gestation. Early latent labor. Obstetrical history significant for hypertension picked up at clinic visit this morning .     Risks, benefits, alternatives and possible complications have been discussed in detail with the patient.  Pre-admission, admission, and post admission procedures and expectations were discussed in detail.  All questions answered, all appropriate consents will be signed at the Hospital. Admission is planned for today.  Augmentation: vaginal Cytotec.    Patient is currently in early latent labor, with mild infrequent contractions; her BPs have been in the  range of (142-152)/(92-96) but she denies RUQ pain, headaches, blurry vision; her platelets, urinalysis and LFTs are within normal limits as well. We are therefore not suspicious of pre-eclampsia at the moment but are inducing labor given the hypertensive findings and her status at 39 wks exactly. We will apply cytotec and consider inserting a Foley balloon.

## 2016-05-02 NOTE — Progress Notes (Signed)
Labor Progress Note Joanne Gross is a 30 y.o. G2P1001 at 735w0d presented for IOl due to gestational HTN S: Doing well. Reports strong contractions and desires epidural  O:  BP 147/83 mmHg  Pulse 86  Temp(Src) 98.1 F (36.7 C) (Oral)  Resp 20  Ht 5\' 4"  (1.626 m)  Wt 189 lb (85.73 kg)  BMI 32.43 kg/m2  SpO2 98%  LMP 08/03/2015 (Exact Date) EFM: 145/mod/+accles, variable decels that are shallow with return to baseline  CVE: Dilation: 4 Effacement (%): 50 Cervical Position: Middle Station: -1 Presentation: Vertex Exam by:: Dr. Alvester MorinNewton, MD   A&P: 30 y.o. G2P1001 5235w0d here for IOL due to gHTN #Labor: s/p cytotec x1. Attempted to place FB but patient progressed quickly to 4cm with aggressive sweeing and FB would not stay in cervix. Will start Pitocin now that bishop>8.  #Pain: planning on epidural, OK to get  #FWB: Cat I #GBS neg   Federico FlakeKimberly Niles Karl Erway, MD 10:08 PM

## 2016-05-02 NOTE — Progress Notes (Signed)
Patient ID: Joanne SentersChristina M Gross, female   DOB: 1986-04-11, 30 y.o.   MRN: [id number] Pt presented to the clinic today for a BP check, BP's were 152/96 & 144/94. After speaking with Dr. Lenn SinkHarrway-Smith she advised that she should be induced today and spoke to the patient about the induction plan. I also spoke to [facility] and they stated they do not have a room currently so allow patient to go home gather their belongings and return back for a direct admit. Pt reports understanding and no further questions.

## 2016-05-02 NOTE — Anesthesia Pain Management Evaluation Note (Signed)
  CRNA Pain Management Visit Note  Patient: Joanne SentersChristina M Landau, 30 y.o., female  "Hello I am a member of the anesthesia team at [facility]. We have an anesthesia team available at all times to provide care throughout the hospital, including epidural management and anesthesia for C-section. I don't know your plan for the delivery whether it a natural birth, water birth, IV sedation, nitrous supplementation, doula or epidural, but we want to meet your pain goals."   1.Was your pain managed to your expectations on prior hospitalizations? NA    2.What is your expectation for pain management during this hospitalization?     Epidural  3.How can we help you reach that goal? Epidural  Record the patient's initial score and the patient's pain goal.   Pain: 5  Pain Goal: 5 The [facility] wants you to be able to say your pain was always managed very well. Patient wants epidural so I told her RN that she wants her epidural now. [name] 05/02/2016

## 2016-05-02 NOTE — H&P (Signed)
OBSTETRIC ADMISSION HISTORY AND PHYSICAL  Joanne Gross is a 30 y.o. female G2P1001 with IUP at 391w0d by LMP presenting for IOL due to pregnancy induced hypertension. Seen in clinic today with elevated BP. She reports +FMs, No LOF, no VB, no blurry vision, headaches or peripheral edema, and RUQ pain.  She plans on breast feeding. She request declines any  birth control.  Dating: By LMP --->  Estimated Date of Delivery: 05/09/16  Prenatal History/Complications:  Past Medical History: Past Medical History  Diagnosis Date  . No pertinent past medical history     Past Surgical History: Past Surgical History  Procedure Laterality Date  . No past surgeries    . Vaginal delivery  04/2012    Obstetrical History: OB History    Gravida Para Term Preterm AB TAB SAB Ectopic Multiple Living   2 1 1  0 0 0 0 0 0 1      Social History: Social History   Social History  . Marital Status: Legally Separated    Spouse Name: N/A  . Number of Children: N/A  . Years of Education: N/A   Social History Main Topics  . Smoking status: Never Smoker   . Smokeless tobacco: Never Used  . Alcohol Use: No  . Drug Use: No  . Sexual Activity: Yes   Other Topics Concern  . None   Social History Narrative    Family History: Family History  Problem Relation Age of Onset  . Hypertension Mother   . Diabetes Maternal Aunt     Allergies: No Known Allergies  Prescriptions prior to admission  Medication Sig Dispense Refill Last Dose  . Calcium Carb-Cholecalciferol (CALCIUM 600 + D PO) Take 2 tablets by mouth daily.   05/02/2016 at Unknown time  . Prenatal Vit-Fe Fumarate-FA (PRENATAL MULTIVITAMIN) TABS Take 1 tablet by mouth daily.   05/02/2016 at Unknown time  . Specialty Vitamins Products (MAGNESIUM, AMINO ACID CHELATE,) 133 MG tablet Take 1 tablet by mouth at bedtime.   Past Month at Unknown time     Review of Systems   All systems reviewed and negative except as stated in HPI  Blood  pressure 139/92, pulse 84, temperature 98.4 F (36.9 C), temperature source Oral, resp. rate 16, height 5\' 4"  (1.626 m), weight 189 lb (85.73 kg), last menstrual period 08/03/2015, SpO2 98 %, unknown if currently breastfeeding. General appearance: alert, cooperative and appears stated age Lungs: clear to auscultation bilaterally Heart: regular rate and rhythm Abdomen: soft, non-tender; bowel sounds normal Pelvic: adequate- see cervical exam Extremities: Homans sign is negative, no sign of DVT DTR's no clonus Presentation: cephalic Fetal monitoringBaseline: 140 bpm, Variability: Good {> 6 bpm), Accelerations: Reactive and Decelerations: Absent Uterine activity Occasional-- every 10-15  Dilation: 2.5 Effacement (%): 50 Station: -3, -2 Exam by:: A Spenski, RN   Prenatal labs: ABO, Rh: --/--/O NEG (05/12 1700) Antibody: POS (05/12 1700) Rubella: Immune RPR: NON REAC (02/22 1457)  HBsAg: NEGATIVE (02/22 1457)  HIV: NONREACTIVE (02/22 1457)  GBS:    1 hr Glucola 124 Genetic screening  Too late to care Anatomy US wnl  Prenatal Transfer Tool  Maternal Diabetes: No Genetic Screening: too late to care Maternal Ultrasounds/Referrals: Normal Fetal Ultrasounds or other Referrals:  None Maternal Substance Abuse:  No Significant Maternal Medications:  None Significant Maternal Lab Results: Lab values include: Group B Strep negative, Rh negative  Results for orders placed or performed during the hospital encounter of 05/02/16 (from the past 24 hour(s))  CBC   Collection Time: 05/02/16  5:00 PM  Result Value Ref Range   WBC 9.1 4.0 - 10.5 K/uL   RBC 4.54 3.87 - 5.11 MIL/uL   Hemoglobin 13.2 12.0 - 15.0 g/dL   HCT 16.1 09.6 - 04.5 %   MCV 83.7 78.0 - 100.0 fL   MCH 29.1 26.0 - 34.0 pg   MCHC 34.7 30.0 - 36.0 g/dL   RDW 40.9 81.1 - 91.4 %   Platelets 288 150 - 400 K/uL  Comprehensive metabolic panel   Collection Time: 05/02/16  5:00 PM  Result Value Ref Range   Sodium 135 135 -  145 mmol/L   Potassium 4.4 3.5 - 5.1 mmol/L   Chloride 104 101 - 111 mmol/L   CO2 20 (L) 22 - 32 mmol/L   Glucose, Bld 82 65 - 99 mg/dL   BUN 11 6 - 20 mg/dL   Creatinine, Ser 7.82 0.44 - 1.00 mg/dL   Calcium 9.5 8.9 - 95.6 mg/dL   Total Protein 6.7 6.5 - 8.1 g/dL   Albumin 3.3 (L) 3.5 - 5.0 g/dL   AST 36 15 - 41 U/L   ALT 31 14 - 54 U/L   Alkaline Phosphatase 131 (H) 38 - 126 U/L   Total Bilirubin 0.6 0.3 - 1.2 mg/dL   GFR calc non Af Amer >60 >60 mL/min   GFR calc Af Amer >60 >60 mL/min   Anion gap 11 5 - 15  Type and screen [facility]   Collection Time: 05/02/16  5:00 PM  Result Value Ref Range   ABO/RH(D) O NEG    Antibody Screen POS    Sample Expiration 05/05/2016     Patient Active Problem List   Diagnosis Date Noted  . Preeclampsia 05/02/2016  . Late prenatal care affecting pregnancy, antepartum 03/06/2016  . Supervision of other normal pregnancy, antepartum 02/20/2016  . Rh negative, antepartum 02/20/2016    Assessment: Joanne Gross is a 30 y.o. G2P1001 at [redacted]w[redacted]d here for IOL for PIH, gestational HTN vs preeX  #Labor: givne relatively favorable cervix will try cytotec q4 hours. If no change in 4 hours, consider FB #Pain: Planning on epidural #FWB: Cat I #ID:  GBS neg #MOF: Breast #MOC:Declines method #Circ:  Desires, planning outpatient   Federico Flake, MD  05/02/2016, 7:23 PM

## 2016-05-02 NOTE — Anesthesia Procedure Notes (Signed)
Epidural Patient location during procedure: OB  Staffing Anesthesiologist: Ashland Wiseman  Preanesthetic Checklist Completed: patient identified, site marked, surgical consent, pre-op evaluation, timeout performed, IV checked, risks and benefits discussed and monitors and equipment checked  Epidural Patient position: sitting Prep: DuraPrep Patient monitoring: heart rate and blood pressure Approach: midline Location: L3-L4 Injection technique: LOR saline  Needle:  Needle type: Tuohy  Needle gauge: 17 G Needle length: 9 cm Needle insertion depth: 5 cm Catheter type: closed end flexible Catheter size: 19 Gauge Catheter at skin depth: 11 cm Test dose: negative and Other  Assessment Events: blood not aspirated, injection not painful, no injection resistance, negative IV test and no paresthesia  Additional Notes Reason for block:procedure for pain   

## 2016-05-02 NOTE — Anesthesia Preprocedure Evaluation (Addendum)
Anesthesia Evaluation  Patient identified by MRN, date of birth, ID band Patient awake    Reviewed: Allergy & Precautions, NPO status , Patient's Chart, lab work & pertinent test results  Airway Mallampati: II  TM Distance: >3 FB Neck ROM: Full    Dental no notable dental hx.    Pulmonary neg pulmonary ROS,    Pulmonary exam normal breath sounds clear to auscultation       Cardiovascular hypertension, Normal cardiovascular exam Rhythm:Regular Rate:Normal     Neuro/Psych negative neurological ROS  negative psych ROS   GI/Hepatic negative GI ROS, Neg liver ROS,   Endo/Other  negative endocrine ROS  Renal/GU negative Renal ROS  negative genitourinary   Musculoskeletal negative musculoskeletal ROS (+)   Abdominal   Peds negative pediatric ROS (+)  Hematology negative hematology ROS (+)   Anesthesia Other Findings   Reproductive/Obstetrics negative OB ROS                             Anesthesia Physical Anesthesia Plan  ASA: II  Anesthesia Plan: Epidural   Post-op Pain Management:    Induction: Intravenous  Airway Management Planned: Natural Airway  Additional Equipment:   Intra-op Plan:   Post-operative Plan:   Informed Consent: I have reviewed the patients History and Physical, chart, labs and discussed the procedure including the risks, benefits and alternatives for the proposed anesthesia with the patient or authorized representative who has indicated his/her understanding and acceptance.   Dental advisory given  Plan Discussed with: CRNA  Anesthesia Plan Comments: (Informed consent obtained prior to proceeding including risk of failure, 1% risk of PDPH, risk of minor discomfort and bruising.  Discussed rare but serious complications including epidural abscess, permanent nerve injury, epidural hematoma.  Discussed alternatives to epidural analgesia and patient desires to  proceed.  Timeout performed pre-procedure verifying patient name, procedure, and platelet count.  Patient tolerated procedure well. )        Anesthesia Quick Evaluation  

## 2016-05-03 ENCOUNTER — Encounter (HOSPITAL_COMMUNITY): Payer: Self-pay

## 2016-05-03 DIAGNOSIS — O1493 Unspecified pre-eclampsia, third trimester: Secondary | ICD-10-CM

## 2016-05-03 DIAGNOSIS — Z3A39 39 weeks gestation of pregnancy: Secondary | ICD-10-CM

## 2016-05-03 LAB — CBC
HEMATOCRIT: 37.4 % (ref 36.0–46.0)
Hemoglobin: 12.7 g/dL (ref 12.0–15.0)
MCH: 28.5 pg (ref 26.0–34.0)
MCHC: 34 g/dL (ref 30.0–36.0)
MCV: 84 fL (ref 78.0–100.0)
Platelets: 239 10*3/uL (ref 150–400)
RBC: 4.45 MIL/uL (ref 3.87–5.11)
RDW: 13.9 % (ref 11.5–15.5)
WBC: 10.3 10*3/uL (ref 4.0–10.5)

## 2016-05-03 LAB — RPR: RPR: NONREACTIVE

## 2016-05-03 MED ORDER — ONDANSETRON HCL 4 MG PO TABS: 4.0000 mg | ORAL_TABLET | ORAL | Status: DC | PRN

## 2016-05-03 MED ORDER — BISACODYL 10 MG RE SUPP: 10.0000 mg | Freq: Every day | RECTAL | Status: DC | PRN

## 2016-05-03 MED ORDER — SODIUM CHLORIDE 0.9% FLUSH: 3.0000 mL | Freq: Two times a day (BID) | INTRAVENOUS | Status: DC

## 2016-05-03 MED ORDER — TETANUS-DIPHTH-ACELL PERTUSSIS 5-2.5-18.5 LF-MCG/0.5 IM SUSP: 0.5000 mL | Freq: Once | INTRAMUSCULAR | Status: DC

## 2016-05-03 MED ORDER — ZOLPIDEM TARTRATE 5 MG PO TABS: 5.0000 mg | ORAL_TABLET | Freq: Every evening | ORAL | Status: DC | PRN

## 2016-05-03 MED ORDER — PRENATAL MULTIVITAMIN CH
1.0000 | ORAL_TABLET | Freq: Every day | ORAL | Status: DC
  Administered 2016-05-03 – 2016-05-04 (×2): 1 via ORAL
  Filled 2016-05-03 (×2): qty 1

## 2016-05-03 MED ORDER — SIMETHICONE 80 MG PO CHEW: 80.0000 mg | CHEWABLE_TABLET | ORAL | Status: DC | PRN

## 2016-05-03 MED ORDER — BENZOCAINE-MENTHOL 20-0.5 % EX AERO
1.0000 "application " | INHALATION_SPRAY | CUTANEOUS | Status: DC | PRN
  Administered 2016-05-03: 1 via TOPICAL
  Filled 2016-05-03: qty 56

## 2016-05-03 MED ORDER — DIPHENHYDRAMINE HCL 25 MG PO CAPS: 25.0000 mg | ORAL_CAPSULE | Freq: Four times a day (QID) | ORAL | Status: DC | PRN

## 2016-05-03 MED ORDER — SENNOSIDES-DOCUSATE SODIUM 8.6-50 MG PO TABS
2.0000 | ORAL_TABLET | ORAL | Status: DC
  Administered 2016-05-03: 2 via ORAL
  Filled 2016-05-03: qty 2

## 2016-05-03 MED ORDER — ACETAMINOPHEN 325 MG PO TABS
650.0000 mg | ORAL_TABLET | ORAL | Status: DC | PRN
  Administered 2016-05-03: 650 mg via ORAL
  Filled 2016-05-03: qty 2

## 2016-05-03 MED ORDER — MEASLES, MUMPS & RUBELLA VAC ~~LOC~~ INJ
0.5000 mL | INJECTION | Freq: Once | SUBCUTANEOUS | Status: DC
  Filled 2016-05-03: qty 0.5

## 2016-05-03 MED ORDER — DIBUCAINE 1 % RE OINT: 1.0000 "application " | TOPICAL_OINTMENT | RECTAL | Status: DC | PRN

## 2016-05-03 MED ORDER — IBUPROFEN 600 MG PO TABS
600.0000 mg | ORAL_TABLET | Freq: Four times a day (QID) | ORAL | Status: DC
  Administered 2016-05-03 – 2016-05-04 (×5): 600 mg via ORAL
  Filled 2016-05-03 (×5): qty 1

## 2016-05-03 MED ORDER — FLEET ENEMA 7-19 GM/118ML RE ENEM: 1.0000 | ENEMA | Freq: Every day | RECTAL | Status: DC | PRN

## 2016-05-03 MED ORDER — WITCH HAZEL-GLYCERIN EX PADS: 1.0000 "application " | MEDICATED_PAD | CUTANEOUS | Status: DC | PRN

## 2016-05-03 MED ORDER — COCONUT OIL OIL: 1.0000 "application " | TOPICAL_OIL | Status: DC | PRN

## 2016-05-03 MED ORDER — ONDANSETRON HCL 4 MG/2ML IJ SOLN: 4.0000 mg | INTRAMUSCULAR | Status: DC | PRN

## 2016-05-03 MED ORDER — SODIUM CHLORIDE 0.9 % IV SOLN: 250.0000 mL | INTRAVENOUS | Status: DC | PRN

## 2016-05-03 MED ORDER — SODIUM CHLORIDE 0.9% FLUSH: 3.0000 mL | INTRAVENOUS | Status: DC | PRN

## 2016-05-03 NOTE — Lactation Note (Signed)
This note was copied from a baby's chart. Lactation Consultation Note  Patient Name: Boy Meredith LeedsChristina Meath RUEAV'WToday's Date: 05/03/2016 Reason for consult: Initial assessment [facility] is 8 hours old [facility]. Baby was born at 2120w1d & weighed 6+14.9# at birth. Baby was with a [facility]. Mom reports that BF has been going well & he is staying latched for ~15 mins each time. Mom reports she BF as well as pumped when she went back to work her 1st child for 12 m. Mom made aware of O/P services, breastfeeding support groups, community resources, and our phone # for post-discharge questions. Reviewed Baby & Me book's Breastfeeding Basics; reviewed engorgement prevention & care since mom had engorgement & mastitis with her 1st child. Mom encouraged to feed baby 8-12 times/24 hours and with feeding cues. Mom reports no questions at this time. Encouraged mom to call for her nurse and/ or lactation if help is needed at future feeds or for assessment.   Maternal Data Does the patient have breastfeeding experience prior to this delivery?: Yes  Feeding Length of feed: 15 min  LATCH Score/Interventions Latch: Repeated attempts needed to sustain latch, nipple held in mouth throughout feeding, stimulation needed to elicit sucking reflex.  Audible Swallowing: Spontaneous and intermittent Intervention(s): Skin to skin  Type of Nipple: Everted at rest and after stimulation  Comfort (Breast/Nipple): Filling, red/small blisters or bruises, mild/mod discomfort  Problem noted: Mild/Moderate discomfort  Hold (Positioning): Assistance needed to correctly position infant at breast and maintain latch.  LATCH Score: 7  Lactation Tools Discussed/Used     Consult Status Consult Status: Follow-up Date: 05/04/16 Follow-up type: In-patient    Oneal GroutLaura C Threasa Kinch 05/03/2016, 5:40 PM

## 2016-05-03 NOTE — Anesthesia Postprocedure Evaluation (Signed)
Anesthesia Post Note  Patient: [name]  Procedure(s) Performed: * No procedures listed *  Patient location during evaluation: Mother Baby Anesthesia Type: Epidural Level of consciousness: awake and alert Pain management: pain level controlled Vital Signs Assessment: post-procedure vital signs reviewed and stable Respiratory status: spontaneous breathing, nonlabored ventilation and respiratory function stable Cardiovascular status: stable Postop Assessment: no headache, no backache, epidural receding, no signs of nausea or vomiting and patient able to bend at knees Anesthetic complications: no     Last Vitals:  Filed Vitals:   05/03/16 1149 05/03/16 1604  BP: 155/88 138/83  Pulse: 97 87  Temp: 37.1 C 36.9 C  Resp: 20 24    Last Pain:  Filed Vitals:   05/03/16 1605  PainSc: 3    Pain Goal: Patients Stated Pain Goal: 3 (05/03/16 1409)               Rica RecordsICKELTON,Zayah Keilman

## 2016-05-04 MED ORDER — IBUPROFEN 600 MG PO TABS: 600.0000 mg | ORAL_TABLET | Freq: Four times a day (QID) | ORAL | Status: DC | PRN

## 2016-05-04 NOTE — Lactation Note (Signed)
This note was copied from a baby's chart. Lactation Consultation Note  Patient Name: Joanne Meredith LeedsChristina Holsinger UJWJX'BToday's Date: 05/04/2016 Reason for consult: Follow-up assessment    With this mom and term baby, now 6227 hours old and being discharged to home today. Mom c/o tender nipples. On exam, no redness or breakdown noted. Mom has lots of easily expressed colostrum, and she was advised to apply and allow to dry. I also gave mom comfort gels to use once home. , and was instructed in their use. The baby has been latching well, adequate wet and stool diapers. On exam of his mouth, he has an upper lip frenulum that extends to the gum line, and a tongue frenulum close to the tip of his tongue, but posterior, that is short and thin. I showed this to the parents, told mom  this is probably why she is sore, but at this time, her baby seems to be doing well. And O/P lactation appointment made for 5/18 at 1030 ma. Mom advised to pump to protect her milk supply, if baby not softening her breasts.Mom also told to supplement with ebm if baby continues to cluster feed into next couple of days. Mom knows to call for questions/concerns.     Maternal Data    Feeding Length of feed: 20 min  LATCH Score/Interventions Latch: Grasps breast easily, tongue down, lips flanged, rhythmical sucking.  Audible Swallowing: Spontaneous and intermittent Intervention(s): Hand expression  Type of Nipple: Everted at rest and after stimulation  Comfort (Breast/Nipple): Filling, red/small blisters or bruises, mild/mod discomfort  Problem noted: Mild/Moderate discomfort  Hold (Positioning): No assistance needed to correctly position infant at breast.  LATCH Score: 9  Lactation Tools Discussed/Used Pump Review: Setup, frequency, and cleaning;Milk Storage (hand expression reviewed)   Consult Status Consult Status: Follow-up Date: 05/08/16 Follow-up type: Out-patient    Alfred LevinsLee, Chance Karam Anne 05/04/2016, 11:47 AM

## 2016-05-04 NOTE — Discharge Instructions (Signed)
Call the clinic [phone]) or go to [facility] hospital for these signs of pre-eclampsia:  Severe headache that does not go away with Tylenol  Visual changes- seeing spots, double, blurred vision  Pain under your right breast or upper abdomen that does not go away with Tums or heartburn medicine  Nausea and/or vomiting  Severe swelling in your hands, feet, and face   Postpartum Care After Vaginal Delivery After you deliver your newborn (postpartum period), the usual stay in the hospital is 24-72 hours. If there were problems with your labor or delivery, or if you have other medical problems, you might be in the hospital longer.  While you are in the hospital, you will receive help and instructions on how to care for yourself and your newborn during the postpartum period.  While you are in the hospital:  Be sure to tell your nurses if you have pain or discomfort, as well as where you feel the pain and what makes the pain worse.  If you had an incision made near your vagina (episiotomy) or if you had some tearing during delivery, the nurses may put ice packs on your episiotomy or tear. The ice packs may help to reduce the pain and swelling.  If you are breastfeeding, you may feel uncomfortable contractions of your uterus for a couple of weeks. This is normal. The contractions help your uterus get back to normal size.  It is normal to have some bleeding after delivery.  For the first 1-3 days after delivery, the flow is red and the amount may be similar to a period.  It is common for the flow to start and stop.  In the first few days, you may pass some small clots. Let your nurses know if you begin to pass large clots or your flow increases.  Do not  flush blood clots down the toilet before having the nurse look at them.  During the next 3-10 days after delivery, your flow should become more watery and pink or brown-tinged in color.  Ten to fourteen days after delivery, your flow  should be a small amount of yellowish-white discharge.  The amount of your flow will decrease over the first few weeks after delivery. Your flow may stop in 6-8 weeks. Most women have had their flow stop by 12 weeks after delivery.  You should change your sanitary pads frequently.  Wash your hands thoroughly with soap and water for at least 20 seconds after changing pads, using the toilet, or before holding or feeding your newborn.  You should feel like you need to empty your bladder within the first 6-8 hours after delivery.  In case you become weak, lightheaded, or faint, call your nurse before you get out of bed for the first time and before you take a shower for the first time.  Within the first few days after delivery, your breasts may begin to feel tender and full. This is called engorgement. Breast tenderness usually goes away within 48-72 hours after engorgement occurs. You may also notice milk leaking from your breasts. If you are not breastfeeding, do not stimulate your breasts. Breast stimulation can make your breasts produce more milk.  Spending as much time as possible with your newborn is very important. During this time, you and your newborn can feel close and get to know each other. Having your newborn stay in your room (rooming in) will help to strengthen the bond with your newborn. It will give you time to get to  know your newborn and become comfortable caring for your newborn.  Your hormones change after delivery. Sometimes the hormone changes can temporarily cause you to feel sad or tearful. These feelings should not last more than a few days. If these feelings last longer than that, you should talk to your caregiver.  If desired, talk to your caregiver about methods of family planning or contraception.  Talk to your caregiver about immunizations. Your caregiver may want you to have the following immunizations before leaving the hospital:  Tetanus, diphtheria, and pertussis  (Tdap) or tetanus and diphtheria (Td) immunization. It is very important that you and your family (including grandparents) or others caring for your newborn are up-to-date with the Tdap or Td immunizations. The Tdap or Td immunization can help protect your newborn from getting ill.  Rubella immunization.  Varicella (chickenpox) immunization.  Influenza immunization. You should receive this annual immunization if you did not receive the immunization during your pregnancy.   This information is not intended to replace advice given to you by your health care provider. Make sure you discuss any questions you have with your health care provider.   Document Released: 10/05/2007 Document Revised: 09/01/2012 Document Reviewed: 08/04/2012 Elsevier Interactive Patient Education [employer]  Breastfeeding Deciding to breastfeed is one of the best choices you can make for you and your baby. A change in hormones during pregnancy causes your breast tissue to grow and increases the number and size of your milk ducts. These hormones also allow proteins, sugars, and fats from your blood supply to make breast milk in your milk-producing glands. Hormones prevent breast milk from being released before your baby is born as well as prompt milk flow after birth. Once breastfeeding has begun, thoughts of your baby, as well as his or her sucking or crying, can stimulate the release of milk from your milk-producing glands.  BENEFITS OF BREASTFEEDING For Your Baby  Your first milk (colostrum) helps your baby's digestive system function better.  There are antibodies in your milk that help your baby fight off infections.  Your baby has a lower incidence of asthma, allergies, and sudden infant death syndrome.  The nutrients in breast milk are better for your baby than infant formulas and are designed uniquely for your baby's needs.  Breast milk improves your baby's brain development.  Your baby is less likely  to develop other conditions, such as childhood obesity, asthma, or type 2 diabetes mellitus. For You  Breastfeeding helps to create a very special bond between you and your baby.  Breastfeeding is convenient. Breast milk is always available at the correct temperature and costs nothing.  Breastfeeding helps to burn calories and helps you lose the weight gained during pregnancy.  Breastfeeding makes your uterus contract to its prepregnancy size faster and slows bleeding (lochia) after you give birth.   Breastfeeding helps to lower your risk of developing type 2 diabetes mellitus, osteoporosis, and breast or ovarian cancer later in life. SIGNS THAT YOUR BABY IS HUNGRY Early Signs of Hunger  Increased alertness or activity.  Stretching.  Movement of the head from side to side.  Movement of the head and opening of the mouth when the corner of the mouth or cheek is stroked (rooting).  Increased sucking sounds, smacking lips, cooing, sighing, or squeaking.  Hand-to-mouth movements.  Increased sucking of fingers or hands. Late Signs of Hunger  Fussing.  Intermittent crying. Extreme Signs of Hunger Signs of extreme hunger will require calming and consoling before your  baby will be able to breastfeed successfully. Do not wait for the following signs of extreme hunger to occur before you initiate breastfeeding:  Restlessness.  A loud, strong cry.  Screaming. BREASTFEEDING BASICS Breastfeeding Initiation  Find a comfortable place to sit or lie down, with your neck and back well supported.  Place a pillow or rolled up blanket under your baby to bring him or her to the level of your breast (if you are seated). Nursing pillows are specially designed to help support your arms and your baby while you breastfeed.  Make sure that your baby's abdomen is facing your abdomen.  Gently massage your breast. With your fingertips, massage from your chest wall toward your nipple in a circular  motion. This encourages milk flow. You may need to continue this action during the feeding if your milk flows slowly.  Support your breast with 4 fingers underneath and your thumb above your nipple. Make sure your fingers are well away from your nipple and your baby's mouth.  Stroke your baby's lips gently with your finger or nipple.  When your baby's mouth is open wide enough, quickly bring your baby to your breast, placing your entire nipple and as much of the colored area around your nipple (areola) as possible into your baby's mouth.  More areola should be visible above your baby's upper lip than below the lower lip.  Your baby's tongue should be between his or her lower gum and your breast.  Ensure that your baby's mouth is correctly positioned around your nipple (latched). Your baby's lips should create a seal on your breast and be turned out (everted).  It is common for your baby to suck about 2-3 minutes in order to start the flow of breast milk. Latching Teaching your baby how to latch on to your breast properly is very important. An improper latch can cause nipple pain and decreased milk supply for you and poor weight gain in your baby. Also, if your baby is not latched onto your nipple properly, he or she may swallow some air during feeding. This can make your baby fussy. Burping your baby when you switch breasts during the feeding can help to get rid of the air. However, teaching your baby to latch on properly is still the best way to prevent fussiness from swallowing air while breastfeeding. Signs that your baby has successfully latched on to your nipple:  Silent tugging or silent sucking, without causing you pain.  Swallowing heard between every 3-4 sucks.  Muscle movement above and in front of his or her ears while sucking. Signs that your baby has not successfully latched on to nipple:  Sucking sounds or smacking sounds from your baby while breastfeeding.  Nipple pain. If  you think your baby has not latched on correctly, slip your finger into the corner of your baby's mouth to break the suction and place it between your baby's gums. Attempt breastfeeding initiation again. Signs of Successful Breastfeeding Signs from your baby:  A gradual decrease in the number of sucks or complete cessation of sucking.  Falling asleep.  Relaxation of his or her body.  Retention of a small amount of milk in his or her mouth.  Letting go of your breast by himself or herself. Signs from you:  Breasts that have increased in firmness, weight, and size 1-3 hours after feeding.  Breasts that are softer immediately after breastfeeding.  Increased milk volume, as well as a change in milk consistency and color by  the fifth day of breastfeeding.  Nipples that are not sore, cracked, or bleeding. Signs That Your Pecola Leisure is Getting Enough Milk  Wetting at least 3 diapers in a 24-hour period. The urine should be clear and pale yellow by age 28 days.  At least 3 stools in a 24-hour period by age 28 days. The stool should be soft and yellow.  At least 3 stools in a 24-hour period by age 16 days. The stool should be seedy and yellow.  No loss of weight greater than 10% of birth weight during the first 21 days of age.  Average weight gain of 4-7 ounces (113-198 g) per week after age 382 days.  Consistent daily weight gain by age 28 days, without weight loss after the age of 2 weeks. After a feeding, your baby may spit up a small amount. This is common. BREASTFEEDING FREQUENCY AND DURATION Frequent feeding will help you make more milk and can prevent sore nipples and breast engorgement. Breastfeed when you feel the need to reduce the fullness of your breasts or when your baby shows signs of hunger. This is called "breastfeeding on demand." Avoid introducing a pacifier to your baby while you are working to establish breastfeeding (the first 4-6 weeks after your baby is born). After this time  you may choose to use a pacifier. Research has shown that pacifier use during the first year of a baby's life decreases the risk of sudden infant death syndrome (SIDS). Allow your baby to feed on each breast as long as he or she wants. Breastfeed until your baby is finished feeding. When your baby unlatches or falls asleep while feeding from the first breast, offer the second breast. Because newborns are often sleepy in the first few weeks of life, you may need to awaken your baby to get him or her to feed. Breastfeeding times will vary from baby to baby. However, the following rules can serve as a guide to help you ensure that your baby is properly fed:  Newborns (babies 36 weeks of age or younger) may breastfeed every 1-3 hours.  Newborns should not go longer than 3 hours during the day or 5 hours during the night without breastfeeding.  You should breastfeed your baby a minimum of 8 times in a 24-hour period until you begin to introduce solid foods to your baby at around 22 months of age. BREAST MILK PUMPING Pumping and storing breast milk allows you to ensure that your baby is exclusively fed your breast milk, even at times when you are unable to breastfeed. This is especially important if you are going back to work while you are still breastfeeding or when you are not able to be present during feedings. Your lactation consultant can give you guidelines on how long it is safe to store breast milk. A breast pump is a machine that allows you to pump milk from your breast into a sterile bottle. The pumped breast milk can then be stored in a refrigerator or freezer. Some breast pumps are operated by hand, while others use electricity. Ask your lactation consultant which type will work best for you. Breast pumps can be purchased, but some hospitals and breastfeeding support groups lease breast pumps on a monthly basis. A lactation consultant can teach you how to hand express breast milk, if you prefer not to  use a pump. CARING FOR YOUR BREASTS WHILE YOU BREASTFEED Nipples can become dry, cracked, and sore while breastfeeding. The following recommendations can help keep  your breasts moisturized and healthy:  Avoid using soap on your nipples.  Wear a supportive bra. Although not required, special nursing bras and tank tops are designed to allow access to your breasts for breastfeeding without taking off your entire bra or top. Avoid wearing underwire-style bras or extremely tight bras.  Air dry your nipples for 3-40minutes after each feeding.  Use only cotton bra pads to absorb leaked breast milk. Leaking of breast milk between feedings is normal.  Use lanolin on your nipples after breastfeeding. Lanolin helps to maintain your skin's normal moisture barrier. If you use pure lanolin, you do not need to wash it off before feeding your baby again. Pure lanolin is not toxic to your baby. You may also hand express a few drops of breast milk and gently massage that milk into your nipples and allow the milk to air dry. In the first few weeks after giving birth, some women experience extremely full breasts (engorgement). Engorgement can make your breasts feel heavy, warm, and tender to the touch. Engorgement peaks within 3-5 days after you give birth. The following recommendations can help ease engorgement:  Completely empty your breasts while breastfeeding or pumping. You may want to start by applying warm, moist heat (in the shower or with warm water-soaked hand towels) just before feeding or pumping. This increases circulation and helps the milk flow. If your baby does not completely empty your breasts while breastfeeding, pump any extra milk after he or she is finished.  Wear a snug bra (nursing or regular) or tank top for 1-2 days to signal your body to slightly decrease milk production.  Apply ice packs to your breasts, unless this is too uncomfortable for you.  Make sure that your baby is latched on  and positioned properly while breastfeeding. If engorgement persists after 48 hours of following these recommendations, contact your health care provider or a Advertising copywriter. OVERALL HEALTH CARE RECOMMENDATIONS WHILE BREASTFEEDING  Eat healthy foods. Alternate between meals and snacks, eating 3 of each per day. Because what you eat affects your breast milk, some of the foods may make your baby more irritable than usual. Avoid eating these foods if you are sure that they are negatively affecting your baby.  Drink milk, fruit juice, and water to satisfy your thirst (about 10 glasses a day).  Rest often, relax, and continue to take your prenatal vitamins to prevent fatigue, stress, and anemia.  Continue breast self-awareness checks.  Avoid chewing and smoking tobacco. Chemicals from cigarettes that pass into breast milk and exposure to secondhand smoke may harm your baby.  Avoid alcohol and drug use, including marijuana. Some medicines that may be harmful to your baby can pass through breast milk. It is important to ask your health care provider before taking any medicine, including all over-the-counter and prescription medicine as well as vitamin and herbal supplements. It is possible to become pregnant while breastfeeding. If birth control is desired, ask your health care provider about options that will be safe for your baby. SEEK MEDICAL CARE IF:  You feel like you want to stop breastfeeding or have become frustrated with breastfeeding.  You have painful breasts or nipples.  Your nipples are cracked or bleeding.  Your breasts are red, tender, or warm.  You have a swollen area on either breast.  You have a fever or chills.  You have nausea or vomiting.  You have drainage other than breast milk from your nipples.  Your breasts do not become full  before feedings by the fifth day after you give birth.  You feel sad and depressed.  Your baby is too sleepy to eat well.  Your  baby is having trouble sleeping.   Your baby is wetting less than 3 diapers in a 24-hour period.  Your baby has less than 3 stools in a 24-hour period.  Your baby's skin or the white part of his or her eyes becomes yellow.   Your baby is not gaining weight by 39 days of age. SEEK IMMEDIATE MEDICAL CARE IF:  Your baby is overly tired (lethargic) and does not want to wake up and feed.  Your baby develops an unexplained fever.   This information is not intended to replace advice given to you by your health care provider. Make sure you discuss any questions you have with your health care provider.   Document Released: 12/08/2005 Document Revised: 08/29/2015 Document Reviewed: 06/01/2013 Elsevier Interactive Patient Education [employer]

## 2016-05-04 NOTE — Discharge Summary (Signed)
OB Discharge Summary     Patient Name: Joanne SentersChristina M Gross DOB: 1986/09/15 MRN: [id number]  Date of admission: 05/02/2016 Delivering MD: Shawna ClampBOOKER, KIMBERLY R   Date of discharge: 05/04/2016  Admitting diagnosis: INDUCTION Intrauterine pregnancy: 3220w1d     Secondary diagnosis:  Active Problems:   Supervision of other normal pregnancy, antepartum   Rh negative, antepartum   Late prenatal care affecting pregnancy, antepartum   Preeclampsia  Additional problems: GHTN     Discharge diagnosis: Term Pregnancy Delivered                                                                                                Post partum procedures:n/a  Augmentation: AROM, Pitocin and Cytotec  Complications: None  Hospital course:  Induction of Labor With Vaginal Delivery   30 y.o. yo [id number] at 8120w1d was admitted to the hospital 05/02/2016 for induction of labor.  Indication for induction: Gestational hypertension.  Patient had an uncomplicated labor course as follows: Membrane Rupture Time/Date: 7:45 AM ,05/03/2016   Intrapartum Procedures: Episiotomy: None [1]                                         Lacerations:  None [1]  Patient had delivery of a Viable infant.  Information for the patient's newborn:  Marita KansasRice, Boy Jene [[id number]][[id number]]  Delivery Method: Vaginal, Spontaneous Delivery (Filed from Delivery Summary)   05/03/2016  Details of delivery can be found in separate delivery note.  Patient had a routine postpartum course. She desires early d/c if possible. Denies ha, visual changes, ruq/epigastric pain, n/v.  Patient is discharged home 05/04/2016.   Physical exam  Filed Vitals:   05/03/16 1604 05/03/16 1849 05/03/16 2337 05/04/16 0631  BP: 138/83 131/85 138/86 139/78  Pulse: 87 95 88 46  Temp: 98.4 F (36.9 C) 98.2 F (36.8 C) 98.9 F (37.2 C) 98.4 F (36.9 C)  TempSrc: Oral Oral Oral Oral  Resp: 24 18    Height:      Weight:      SpO2: 98% 99%     General: alert, cooperative and  no distress Lochia: appropriate Uterine Fundus: firm Incision: N/A DVT Evaluation: No evidence of DVT seen on physical exam. Negative Homan's sign. No cords or calf tenderness. No significant calf/ankle edema. Labs: Lab Results  Component Value Date   WBC 10.3 05/03/2016   HGB 12.7 05/03/2016   HCT 37.4 05/03/2016   MCV 84.0 05/03/2016   PLT 239 05/03/2016   CMP Latest Ref Rng 05/02/2016  Glucose 65 - 99 mg/dL 82  BUN 6 - 20 mg/dL 11  Creatinine 1.300.44 - 8.651.00 mg/dL 7.840.77  Sodium 696135 - 295145 mmol/L 135  Potassium 3.5 - 5.1 mmol/L 4.4  Chloride 101 - 111 mmol/L 104  CO2 22 - 32 mmol/L 20(L)  Calcium 8.9 - 10.3 mg/dL 9.5  Total Protein 6.5 - 8.1 g/dL 6.7  Total Bilirubin 0.3 - 1.2 mg/dL 0.6  Alkaline Phos 38 - 126 U/L 131(H)  AST 15 -  41 U/L 36  ALT 14 - 54 U/L 31    Discharge instruction: per After Visit Summary and "Baby and Me Booklet".  After visit meds:    Medication List    STOP taking these medications        CALCIUM 600 + D PO      TAKE these medications        ibuprofen 600 MG tablet  Commonly known as:  ADVIL,MOTRIN  Take 1 tablet (600 mg total) by mouth every 6 (six) hours as needed for mild pain, moderate pain or cramping.     magnesium (amino acid chelate) 133 MG tablet  Take 1 tablet by mouth at bedtime.     prenatal multivitamin Tabs tablet  Take 1 tablet by mouth daily.        Diet: routine diet  Activity: Advance as tolerated. Pelvic rest for 6 weeks.   Outpatient follow up:6 weeks, Baby Love 1wk for bp check Follow up Appt:Future Appointments Date Time Provider Department Center  05/06/2016 2:30 PM Judeth Horn, NP [facility]   Follow up Visit:No Follow-up on file.  Postpartum contraception: Condoms  Newborn Data: Live born female  Birth Weight: 6 lb 14.9 oz (3144 g) APGAR: 8, 9  Baby Feeding: Breast Disposition:home with mother pending d/c from peds Discussed pre-e s/s, reasons to return  05/04/2016 Marge Duncans,  CNM

## 2016-05-06 ENCOUNTER — Encounter: Payer: Medicaid Other | Admitting: Student

## 2016-05-06 LAB — TYPE AND SCREEN
ABO/RH(D): O NEG
ANTIBODY SCREEN: POSITIVE
DAT, IgG: NEGATIVE
UNIT DIVISION: 0
UNIT DIVISION: 0
Unit division: 0
Unit division: 0

## 2016-05-08 ENCOUNTER — Ambulatory Visit (HOSPITAL_COMMUNITY): Payer: Self-pay

## 2016-05-13 ENCOUNTER — Ambulatory Visit (HOSPITAL_COMMUNITY)
Admission: RE | Admit: 2016-05-13 | Discharge: 2016-05-13 | Disposition: A | Discharge status: Home / Self Care | Payer: Self-pay | Source: Ambulatory Visit | Attending: Obstetrics & Gynecology | Admitting: Obstetrics & Gynecology

## 2016-06-25 ENCOUNTER — Ambulatory Visit: Payer: Medicaid Other | Admitting: Obstetrics and Gynecology

## 2016-07-29 ENCOUNTER — Ambulatory Visit (INDEPENDENT_AMBULATORY_CARE_PROVIDER_SITE_OTHER): Payer: Medicaid Other | Admitting: Medical

## 2016-07-29 ENCOUNTER — Encounter: Payer: Self-pay | Admitting: Medical

## 2016-07-29 NOTE — Patient Instructions (Signed)

## 2016-07-29 NOTE — Progress Notes (Signed)
Subjective:     Joanne SentersChristina M Gerety is a 30 y.o. female who presents for a postpartum visit. She is several weeks postpartum following a spontaneous vaginal delivery. I have fully reviewed the prenatal and intrapartum course. The delivery was at 39.1 gestational weeks. Outcome: spontaneous vaginal delivery. Anesthesia: epidural. Postpartum course has been normal. Baby's course has been normal. Baby is feeding by breast. Bleeding no bleeding. Bowel function is normal. Bladder function is normal. Patient is sexually active. Contraception method is none. Postpartum depression screening: negative.  The following portions of the patient's history were reviewed and updated as appropriate: allergies, current medications, past family history, past medical history, past social history, past surgical history and problem list.  Review of Systems Pertinent items are noted in HPI.   Objective:    BP 123/85   Pulse 73   General:  alert and cooperative   Breasts:  not performed  Lungs: clear to auscultation bilaterally  Heart:  regular rate and rhythm, S1, S2 normal, no murmur, click, rub or gallop  Abdomen: soft, non-tender; bowel sounds normal; no masses,  no organomegaly   Vulva:  normal  Vagina: not evaluated  Cervix:  not evaluated  Corpus: not examined  Adnexa:  not evaluated  Rectal Exam: Not performed.        Assessment:     Normal postpartum exam. Pap smear not done at today's visit. Normal pap smear result during pregnancy  Plan:    1. Contraception: none patient states that she prefers natural family planning. Advised that this would be difficult while breastfeeding, patient acknowledges understanding.  2. Follow up in: 1 year for annual exam or as needed.    Marny LowensteinJulie N Wenzel, PA-C 07/29/2016 11:17 AM

## 2016-12-22 NOTE — L&D Delivery Note (Signed)
Patient is a 31 y.o. now G3P3003 s/p NSVD at [redacted]w[redacted]d, who was admitted for SOL, progressed normally, SROM at 0120 with light meconium noted.  Delivery Note At 2:19 AM a viable female was delivered via  (Presentation: vertex;  LOA). APGAR: 9, 9; weight 8lb 7.6oz (3844g) Placenta status: intact; to L&D Cord:  3-vessel  Anesthesia:  Epidural Episiotomy:  none Lacerations:  none Suture Repair: none Est. Blood Loss (mL):  200  Head delivered LOA. No nuchal cord present. Shoulder and body delivered in usual fashion. Infant with spontaneous cry, placed on mother's abdomen, dried and bulb suctioned. Cord clamped x 2 after 1-minute delay, and cut by family member. Cord blood drawn. Placenta delivered spontaneously with gentle cord traction. Fundus firm with massage and Pitocin. Perineum inspected and found to have no lacerations.  Mom to postpartum.  Baby to Couplet care / Skin to Skin.  Raynelle Fanning P. Randon Somera, MD OB Fellow 09/28/17, 2:39 AM

## 2017-04-30 ENCOUNTER — Encounter: Payer: Self-pay | Admitting: *Deleted

## 2017-04-30 ENCOUNTER — Ambulatory Visit (INDEPENDENT_AMBULATORY_CARE_PROVIDER_SITE_OTHER): Payer: Medicaid Other | Admitting: *Deleted

## 2017-04-30 ENCOUNTER — Encounter: Payer: Self-pay | Admitting: General Practice

## 2017-04-30 DIAGNOSIS — Z3490 Encounter for supervision of normal pregnancy, unspecified, unspecified trimester: Secondary | ICD-10-CM

## 2017-04-30 LAB — POCT PREGNANCY, URINE: PREG TEST UR: POSITIVE — AB

## 2017-04-30 NOTE — Progress Notes (Signed)
Patient presents to clinic for pregnancy test which is positive. Patient given results, reviewed allergies and medications. Discussed beginning prenatal care, patient stated that she has called every practice in town but they are either full or don't take medicaid. [facility] Patient voiced understanding. To front desk to get pregnancy verification letter.

## 2017-05-14 ENCOUNTER — Ambulatory Visit (INDEPENDENT_AMBULATORY_CARE_PROVIDER_SITE_OTHER): Payer: Medicaid Other | Admitting: Advanced Practice Midwife

## 2017-05-14 ENCOUNTER — Encounter: Payer: Self-pay | Admitting: Advanced Practice Midwife

## 2017-05-14 ENCOUNTER — Other Ambulatory Visit (HOSPITAL_COMMUNITY)
Admission: RE | Admit: 2017-05-14 | Discharge: 2017-05-14 | Disposition: A | Discharge status: Home / Self Care | Payer: Medicaid Other | Source: Ambulatory Visit | Attending: Advanced Practice Midwife | Admitting: Advanced Practice Midwife

## 2017-05-14 ENCOUNTER — Encounter: Payer: Medicaid Other | Admitting: Advanced Practice Midwife

## 2017-05-14 VITALS — BP 125/75 | HR 96 | Wt 162.4 lb

## 2017-05-14 DIAGNOSIS — Z3402 Encounter for supervision of normal first pregnancy, second trimester: Secondary | ICD-10-CM | POA: Diagnosis not present

## 2017-05-14 DIAGNOSIS — Z3689 Encounter for other specified antenatal screening: Secondary | ICD-10-CM

## 2017-05-14 DIAGNOSIS — Z113 Encounter for screening for infections with a predominantly sexual mode of transmission: Secondary | ICD-10-CM

## 2017-05-14 DIAGNOSIS — Z3480 Encounter for supervision of other normal pregnancy, unspecified trimester: Secondary | ICD-10-CM | POA: Insufficient documentation

## 2017-05-14 DIAGNOSIS — Z3482 Encounter for supervision of other normal pregnancy, second trimester: Secondary | ICD-10-CM

## 2017-05-14 DIAGNOSIS — Z3A21 21 weeks gestation of pregnancy: Secondary | ICD-10-CM | POA: Diagnosis not present

## 2017-05-14 LAB — POCT URINALYSIS DIP (DEVICE)
Bilirubin Urine: NEGATIVE
GLUCOSE, UA: NEGATIVE mg/dL
Hgb urine dipstick: NEGATIVE
Ketones, ur: NEGATIVE mg/dL
NITRITE: NEGATIVE
PROTEIN: NEGATIVE mg/dL
Specific Gravity, Urine: 1.025 (ref 1.005–1.030)
UROBILINOGEN UA: 0.2 mg/dL (ref 0.0–1.0)
pH: 7 (ref 5.0–8.0)

## 2017-05-14 NOTE — Progress Notes (Signed)
Initial prenatal education packet given Breastfeeding education done Babyscripts app only

## 2017-05-14 NOTE — Progress Notes (Signed)
  Subjective:    Joanne Gross is being seen today for her first obstetrical visit.  This is a planned pregnancy. She is at 39w0dgestation. Her obstetrical history is significant for pregnancy induced hypertension. Relationship with FOB: spouse, living together. Patient does intend to breast feed. Is currently BF 1104year-old and plans to tandem feed. Pregnancy history fully reviewed.  Patient reports no complaints.  Review of Systems:   Review of Systems  Constitutional: Negative for chills and fever.  Eyes: Negative for visual disturbance.  Gastrointestinal: Positive for nausea. Negative for abdominal pain, diarrhea and vomiting.  Genitourinary: Negative for vaginal bleeding and vaginal discharge.  Neurological: Negative for headaches.    Objective:     BP 125/75   Pulse 96   Wt 162 lb 6.4 oz (73.7 kg)   LMP 12/18/2016 (Exact Date)   BMI 27.88 kg/m   Physical Exam  Nursing note and vitals reviewed. Constitutional: She is oriented to person, place, and time. She appears well-developed and well-nourished. No distress.  Eyes: No scleral icterus.  Neck: No thyromegaly present.  Cardiovascular: Normal rate, regular rhythm and normal heart sounds.   Respiratory: Effort normal and breath sounds normal. No respiratory distress. Right breast exhibits no inverted nipple, no mass, no nipple discharge, no skin change and no tenderness. Left breast exhibits no inverted nipple, no mass, no nipple discharge, no skin change and no tenderness. Breasts are symmetrical.  GI: Soft. There is no tenderness.  Genitourinary:  Genitourinary Comments: declined  Musculoskeletal: Normal range of motion. She exhibits no edema or tenderness.  Neurological: She is alert and oriented to person, place, and time. She has normal reflexes.  Skin: Skin is warm and dry.  Psychiatric: She has a normal mood and affect.      Fetal Exam Fetal Monitor Review: Mode: hand-held doppler probe.   Baseline rate:  140's.     Fundal ht 21 cm    Assessment:    Pregnancy: [id number] Active Problem List   Diagnosis Date Noted  . Supervision of other normal pregnancy, antepartum 05/14/2017  1. Supervision of other normal pregnancy, antepartum  - Obstetric Panel, Including HIV - Cervicovaginal ancillary only - Culture, OB Urine - Protein / Creatinine Ratio, Urine - Comp Met (CMET) - AFP TETRA  2. Encounter for fetal anatomic survey  - [place] OB COMP + 14 WK; Future     Hx GHTN   Plan:     Initial labs drawn. Prenatal vitamins. Problem list reviewed and updated. AFP3 discussed: ordered. Role of ultrasound in pregnancy discussed; fetal survey: ordered. Amniocentesis discussed: not indicated. Follow up in 4 weeks.    VManya Silvas5/24/2018

## 2017-05-14 NOTE — Patient Instructions (Signed)

## 2017-05-15 LAB — OBSTETRIC PANEL, INCLUDING HIV
Antibody Screen: NEGATIVE
Basophils Absolute: 0 10*3/uL (ref 0.0–0.2)
Basos: 0 %
EOS (ABSOLUTE): 0 10*3/uL (ref 0.0–0.4)
Eos: 0 %
HEMOGLOBIN: 12.5 g/dL (ref 11.1–15.9)
HEP B S AG: NEGATIVE
HIV Screen 4th Generation wRfx: NONREACTIVE
Hematocrit: 37.6 % (ref 34.0–46.6)
IMMATURE GRANULOCYTES: 1 %
Immature Grans (Abs): 0.2 10*3/uL — ABNORMAL HIGH (ref 0.0–0.1)
LYMPHS ABS: 2 10*3/uL (ref 0.7–3.1)
Lymphs: 17 %
MCH: 27.9 pg (ref 26.6–33.0)
MCHC: 33.2 g/dL (ref 31.5–35.7)
MCV: 84 fL (ref 79–97)
Monocytes Absolute: 0.6 10*3/uL (ref 0.1–0.9)
Monocytes: 5 %
NEUTROS PCT: 77 %
Neutrophils Absolute: 9.2 10*3/uL — ABNORMAL HIGH (ref 1.4–7.0)
Platelets: 276 10*3/uL (ref 150–379)
RBC: 4.48 x10E6/uL (ref 3.77–5.28)
RDW: 14.5 % (ref 12.3–15.4)
RH TYPE: NEGATIVE
RPR: NONREACTIVE
Rubella Antibodies, IGG: 9.74 index (ref >0.99)
WBC: 12.1 10*3/uL — AB (ref 3.4–10.8)

## 2017-05-15 LAB — COMPREHENSIVE METABOLIC PANEL
A/G RATIO: 1.3 (ref 1.2–2.2)
ALT: 12 IU/L (ref 0–32)
AST: 14 IU/L (ref 0–40)
Albumin: 3.9 g/dL (ref 3.5–5.5)
Alkaline Phosphatase: 58 IU/L (ref 39–117)
BUN/Creatinine Ratio: 12 (ref 9–23)
BUN: 8 mg/dL (ref 6–20)
Bilirubin Total: <0.2 mg/dL (ref 0.0–1.2)
CO2: 21 mmol/L (ref 18–29)
CREATININE: 0.65 mg/dL (ref 0.57–1.00)
Calcium: 8.9 mg/dL (ref 8.7–10.2)
Chloride: 104 mmol/L (ref 96–106)
GFR, EST AFRICAN AMERICAN: 139 mL/min/{1.73_m2} (ref >59)
GFR, EST NON AFRICAN AMERICAN: 120 mL/min/{1.73_m2} (ref >59)
GLUCOSE: 85 mg/dL (ref 65–99)
Globulin, Total: 3 g/dL (ref 1.5–4.5)
Potassium: 4.1 mmol/L (ref 3.5–5.2)
Sodium: 138 mmol/L (ref 134–144)
TOTAL PROTEIN: 6.9 g/dL (ref 6.0–8.5)

## 2017-05-15 LAB — PROTEIN / CREATININE RATIO, URINE
Creatinine, Urine: 188.4 mg/dL
PROTEIN/CREAT RATIO: 104 mg/g{creat} (ref 0–200)
Protein, Ur: 19.6 mg/dL

## 2017-05-19 LAB — CERVICOVAGINAL ANCILLARY ONLY
Chlamydia: NEGATIVE
NEISSERIA GONORRHEA: NEGATIVE

## 2017-05-20 LAB — URINE CULTURE, OB REFLEX

## 2017-05-20 LAB — CULTURE, OB URINE

## 2017-05-22 ENCOUNTER — Encounter: Payer: Self-pay | Admitting: Advanced Practice Midwife

## 2017-05-22 DIAGNOSIS — Z8759 Personal history of other complications of pregnancy, childbirth and the puerperium: Secondary | ICD-10-CM | POA: Insufficient documentation

## 2017-05-25 LAB — AFP TETRA
DIA Mom Value: 0.63
DIA VALUE (EIA): 136.01 pg/mL
DSR (By Age)    1 IN: 732
DSR (SECOND TRIMESTER) 1 IN: 10000
GESTATIONAL AGE AFP: 21 wk
MATERNAL AGE AT EDD: 29.6 a
MSAFP Mom: 1.05
MSAFP: 70.6 ng/mL
MSHCG MOM: 0.42
MSHCG: 9201 m[IU]/mL
Osb Risk: 10000
Test Results:: NEGATIVE
UE3 MOM: 1.14
Weight: 162 [lb_av]
uE3 Value: 2.32 ng/mL

## 2017-05-29 ENCOUNTER — Ambulatory Visit (HOSPITAL_COMMUNITY): Payer: Medicaid Other

## 2017-06-01 ENCOUNTER — Ambulatory Visit (HOSPITAL_COMMUNITY)
Admission: RE | Admit: 2017-06-01 | Discharge: 2017-06-01 | Disposition: A | Discharge status: Home / Self Care | Payer: Medicaid Other | Source: Ambulatory Visit | Attending: Advanced Practice Midwife | Admitting: Advanced Practice Midwife

## 2017-06-01 DIAGNOSIS — Z3A23 23 weeks gestation of pregnancy: Secondary | ICD-10-CM | POA: Diagnosis not present

## 2017-06-01 DIAGNOSIS — O0932 Supervision of pregnancy with insufficient antenatal care, second trimester: Secondary | ICD-10-CM | POA: Diagnosis not present

## 2017-06-01 DIAGNOSIS — Z3689 Encounter for other specified antenatal screening: Secondary | ICD-10-CM | POA: Diagnosis present

## 2017-06-01 DIAGNOSIS — Z3480 Encounter for supervision of other normal pregnancy, unspecified trimester: Secondary | ICD-10-CM

## 2017-06-10 ENCOUNTER — Encounter: Payer: Self-pay | Admitting: *Deleted

## 2017-06-11 ENCOUNTER — Ambulatory Visit (INDEPENDENT_AMBULATORY_CARE_PROVIDER_SITE_OTHER): Payer: Medicaid Other | Admitting: Advanced Practice Midwife

## 2017-06-11 DIAGNOSIS — Z3480 Encounter for supervision of other normal pregnancy, unspecified trimester: Secondary | ICD-10-CM

## 2017-06-11 DIAGNOSIS — Z3482 Encounter for supervision of other normal pregnancy, second trimester: Secondary | ICD-10-CM

## 2017-06-11 NOTE — Progress Notes (Signed)
   PRENATAL VISIT NOTE  Subjective:  Joanne Gross is a 31 y.o. G3P2002 at 8241w0d being seen today for ongoing prenatal care.  She is currently monitored for the following issues for this low-risk pregnancy and has Supervision of other normal pregnancy, antepartum and History of gestational hypertension on her problem list.  Patient reports no complaints.  Contractions: Not present. Vag. Bleeding: None.  Movement: Present. Denies leaking of fluid.   The following portions of the patient's history were reviewed and updated as appropriate: allergies, current medications, past family history, past medical history, past social history, past surgical history and problem list. Problem list updated.  Objective:   Vitals:   06/11/17 0937  BP: 134/75  Pulse: 86  Weight: 169 lb 4.8 oz (76.8 kg)    Fetal Status: Fetal Heart Rate (bpm): 152   Movement: Present     General:  Alert, oriented and cooperative. Patient is in no acute distress.  Skin: Skin is warm and dry. No rash noted.   Cardiovascular: Normal heart rate noted  Respiratory: Normal respiratory effort, no problems with respiration noted  Abdomen: Soft, gravid, appropriate for gestational age. Pain/Pressure: Present     Pelvic:  Cervical exam deferred        Extremities: Normal range of motion.  Edema: Trace.  Mild bilateral hand and foot soreness.  Mental Status: Normal mood and affect. Normal behavior. Normal judgment and thought content.   Assessment and Plan:  Pregnancy: G3P2002 at 7941w0d  1. Supervision of other normal pregnancy, antepartum Patients questions and concerns were addressed.  She was educated on various contraception options including BTL, vasectomy, nexplanon and IUD.  She was informed her next visit in 3 weeks will include a 2 hr GTT and she should come fasting that morning.   Preterm labor symptoms and general obstetric precautions including but not limited to vaginal bleeding, contractions, leaking of fluid  and fetal movement were reviewed in detail with the patient. Please refer to After Visit Summary for other counseling recommendations.  Return in about 3 weeks (around 07/02/2017) for ROB/GTT.   Chrissie NoaMatthew Rayshaun Needle, Student-PA

## 2017-06-11 NOTE — Patient Instructions (Addendum)
AREA PEDIATRIC/FAMILY [place] [address] [place], [place]  [address] Phone - [phone]   Fax - [phone]  [facility] [address] [place], [place] [address] Phone - [phone]   Fax - [place] [address] [place], [place]  [address] Phone - [phone]   Fax - [phone]  [place] [address] [place] [address] [place], [place]  [address] Phone - [phone]   Fax - [phone]  [place] [address] [place], [place]  [address] Phone - [phone]   Fax - [phone]  [facility] [address] [place], [place]  [address] Phone - [phone]   Fax - [place] [address] [place], [place]  [address] Phone - [phone]   Fax - [phone]  [place] [address] [place], [place] [address] [place], [place]  [address] Phone - [phone]   Fax - [place] [address] [place], [place]  [address] Phone - [phone]   Fax - [phone] [facility] [place]. [address] [place], [place]  [address] Phone - [phone]   Fax - [phone]  [place] [address] [place], [place]  [address] Phone - [phone]   Fax - [phone]  [facility] [place], [place], [place]  [address] Phone - [phone]   Fax - [place] [address] [place], [place]  [address] Phone - [phone]   Fax - [phone]  [facility] [address] [place], [place]  [address] Phone - [place], [place]  [address] Phone - [phone]   Fax - [place] [address], [place], [place]  [address] Phone - [phone]   Fax - [phone]  [place] [address] [place], [place]  [address] Phone - [phone]   Fax - [place]. [place], [place]  [address] Phone - [phone]   Fax - [place], [place], [place]  [address] Phone - [phone]   Fax - [place] [address] [place], [place]  [address] Phone - [phone]   Fax - [phone]  [place] [place] [address] [place], [place]  [address] Phone - [phone]   Fax - [place] [place], [place]  [address] Phone - [phone]   Fax - [phone]  [place] [address] [place], [place]  [address] Phone - [phone]   Fax - [phone] [place] [address] [place],   [address] Phone - [phone]   Fax - [place] [address] [place],   [address] Phone - [phone]   Fax - [place] [address], [place],   [address] Phone - [phone]   Fax - [phone]  [place] [address] [place] [address] Phone [phone]  Fax [phone]  Contraception Choices Contraception (birth control) is the use of any methods or devices to prevent  pregnancy. Below are some methods to help avoid pregnancy. Hormonal methods  Contraceptive implant. This is a thin, plastic tube containing progesterone hormone. It does not contain estrogen hormone. Your health care provider inserts the tube in the inner part of the upper arm. The tube can remain in place for up to 3 years. After 3 years, the implant must be removed. The implant prevents the  ovaries from releasing an egg (ovulation), thickens the cervical mucus to prevent sperm from entering the uterus, and thins the lining of the inside of the uterus.  Progesterone-only injections. These injections are given every 3 months by your health care provider to prevent pregnancy. This synthetic progesterone hormone stops the ovaries from releasing eggs. It also thickens cervical mucus and changes the uterine lining. This makes it harder for sperm to survive in the uterus.  Birth control pills. These pills contain estrogen and progesterone hormone. They work by preventing the ovaries from releasing eggs (ovulation). They also cause the cervical mucus to thicken, preventing the sperm from entering the uterus. Birth control pills are prescribed by a health care provider.Birth control pills can also be used to treat heavy periods.  Minipill. This type of birth control pill contains only the progesterone hormone. They are taken every day of each month and must be prescribed by your health care provider.  Birth control patch. The patch contains hormones similar to those in birth control pills. It must be changed once a week and is prescribed by a health care provider.  Vaginal ring. The ring contains hormones similar to those in birth control pills. It is left in the vagina for 3 weeks, removed for 1 week, and then a new one is put back in place. The patient must be comfortable inserting and removing the ring from the vagina.A health care provider's prescription is necessary.  Emergency contraception. Emergency contraceptives prevent pregnancy after unprotected sexual intercourse. This pill can be taken right after sex or up to 5 days after unprotected sex. It is most effective the sooner you take the pills after having sexual intercourse. Most emergency contraceptive pills are available without a prescription. Check with your pharmacist. Do not use emergency contraception as your only form of birth  control. Barrier methods  Female condom. This is a thin sheath (latex or rubber) that is worn over the penis during sexual intercourse. It can be used with spermicide to increase effectiveness.  Female condom. This is a soft, loose-fitting sheath that is put into the vagina before sexual intercourse.  Diaphragm. This is a soft, latex, dome-shaped barrier that must be fitted by a health care provider. It is inserted into the vagina, along with a spermicidal jelly. It is inserted before intercourse. The diaphragm should be left in the vagina for 6 to 8 hours after intercourse.  Cervical cap. This is a round, soft, latex or plastic cup that fits over the cervix and must be fitted by a health care provider. The cap can be left in place for up to 48 hours after intercourse.  Sponge. This is a soft, circular piece of polyurethane foam. The sponge has spermicide in it. It is inserted into the vagina after wetting it and before sexual intercourse.  Spermicides. These are chemicals that kill or block sperm from entering the cervix and uterus. They come in the form of creams, jellies, suppositories, foam, or tablets. They do not require a prescription. They are inserted into the vagina with an applicator before having sexual intercourse. The process   must be repeated every time you have sexual intercourse. Intrauterine contraception  Intrauterine device (IUD). This is a T-shaped device that is put in a woman's uterus during a menstrual period to prevent pregnancy. There are 2 types: ? Copper IUD. This type of IUD is wrapped in copper wire and is placed inside the uterus. Copper makes the uterus and fallopian tubes produce a fluid that kills sperm. It can stay in place for 10 years. ? Hormone IUD. This type of IUD contains the hormone progestin (synthetic progesterone). The hormone thickens the cervical mucus and prevents sperm from entering the uterus, and it also thins the uterine lining to prevent  implantation of a fertilized egg. The hormone can weaken or kill the sperm that get into the uterus. It can stay in place for 3-5 years, depending on which type of IUD is used. Permanent methods of contraception  Female tubal ligation. This is when the woman's fallopian tubes are surgically sealed, tied, or blocked to prevent the egg from traveling to the uterus.  Hysteroscopic sterilization. This involves placing a small coil or insert into each fallopian tube. Your doctor uses a technique called hysteroscopy to do the procedure. The device causes scar tissue to form. This results in permanent blockage of the fallopian tubes, so the sperm cannot fertilize the egg. It takes about 3 months after the procedure for the tubes to become blocked. You must use another form of birth control for these 3 months.  Female sterilization. This is when the female has the tubes that carry sperm tied off (vasectomy).This blocks sperm from entering the vagina during sexual intercourse. After the procedure, the man can still ejaculate fluid (semen). Natural planning methods  Natural family planning. This is not having sexual intercourse or using a barrier method (condom, diaphragm, cervical cap) on days the woman could become pregnant.  Calendar method. This is keeping track of the length of each menstrual cycle and identifying when you are fertile.  Ovulation method. This is avoiding sexual intercourse during ovulation.  Symptothermal method. This is avoiding sexual intercourse during ovulation, using a thermometer and ovulation symptoms.  Post-ovulation method. This is timing sexual intercourse after you have ovulated. Regardless of which type or method of contraception you choose, it is important that you use condoms to protect against the transmission of sexually transmitted infections (STIs). Talk with your health care provider about which form of contraception is most appropriate for you. This information is not  intended to replace advice given to you by your health care provider. Make sure you discuss any questions you have with your health care provider. Document Released: 12/08/2005 Document Revised: 05/15/2016 Document Reviewed: 06/02/2013 Elsevier Interactive Patient Education  2017 [employer]   Tdap Vaccine (Tetanus, Diphtheria and Pertussis): What You Need to Know 1. Why get vaccinated? Tetanus, diphtheria and pertussis are very serious diseases. Tdap vaccine can protect us from these diseases. And, Tdap vaccine given to pregnant women can protect newborn babies against pertussis. TETANUS (Lockjaw) is rare in the [place] today. It causes painful muscle tightening and stiffness, usually all over the body.  It can lead to tightening of muscles in the head and neck so you can't open your mouth, swallow, or sometimes even breathe. Tetanus kills about 1 out of 10 people who are infected even after receiving the best medical care.  DIPHTHERIA is also rare in the [place] today. It can cause a thick coating to form in the back of the throat.  It  can lead to breathing problems, heart failure, paralysis, and death.  PERTUSSIS (Whooping Cough) causes severe coughing spells, which can cause difficulty breathing, vomiting and disturbed sleep.  It can also lead to weight loss, incontinence, and rib fractures. Up to 2 in 100 adolescents and 5 in 100 adults with pertussis are hospitalized or have complications, which could include pneumonia or death.  These diseases are caused by bacteria. Diphtheria and pertussis are spread from person to person through secretions from coughing or sneezing. Tetanus enters the body through cuts, scratches, or wounds. Before vaccines, as many as 200,000 cases of diphtheria, 200,000 cases of pertussis, and hundreds of cases of tetanus, were reported in the [place] each year. Since vaccination began, reports of cases for tetanus and diphtheria have dropped by  about 99% and for pertussis by about 80%. 2. Tdap vaccine Tdap vaccine can protect adolescents and adults from tetanus, diphtheria, and pertussis. One dose of Tdap is routinely given at age 73 or 66. People who did not get Tdap at that age should get it as soon as possible. Tdap is especially important for healthcare professionals and anyone having close contact with a baby younger than 12 months. Pregnant women should get a dose of Tdap during every pregnancy, to protect the newborn from pertussis. Infants are most at risk for severe, life-threatening complications from pertussis. Another vaccine, called Td, protects against tetanus and diphtheria, but not pertussis. A Td booster should be given every 10 years. Tdap may be given as one of these boosters if you have never gotten Tdap before. Tdap may also be given after a severe cut or burn to prevent tetanus infection. Your doctor or the person giving you the vaccine can give you more information. Tdap may safely be given at the same time as other vaccines. 3. Some people should not get this vaccine  A person who has ever had a life-threatening allergic reaction after a previous dose of any diphtheria, tetanus or pertussis containing vaccine, OR has a severe allergy to any part of this vaccine, should not get Tdap vaccine. Tell the person giving the vaccine about any severe allergies.  Anyone who had coma or long repeated seizures within 7 days after a childhood dose of DTP or DTaP, or a previous dose of Tdap, should not get Tdap, unless a cause other than the vaccine was found. They can still get Td.  Talk to your doctor if you: ? have seizures or another nervous system problem, ? had severe pain or swelling after any vaccine containing diphtheria, tetanus or pertussis, ? ever had a condition called Guillain-Barr Syndrome (GBS), ? aren't feeling well on the day the shot is scheduled. 4. Risks With any medicine, including vaccines, there is a  chance of side effects. These are usually mild and go away on their own. Serious reactions are also possible but are rare. Most people who get Tdap vaccine do not have any problems with it. Mild problems following Tdap: (Did not interfere with activities)  Pain where the shot was given (about 3 in 4 adolescents or 2 in 3 adults)  Redness or swelling where the shot was given (about 1 person in 5)  Mild fever of at least 100.21F (up to about 1 in 25 adolescents or 1 in 100 adults)  Headache (about 3 or 4 people in 10)  Tiredness (about 1 person in 3 or 4)  Nausea, vomiting, diarrhea, stomach ache (up to 1 in 4 adolescents or 1 in 10  adults)  Chills, sore joints (about 1 person in 10)  Body aches (about 1 person in 3 or 4)  Rash, swollen glands (uncommon)  Moderate problems following Tdap: (Interfered with activities, but did not require medical attention)  Pain where the shot was given (up to 1 in 5 or 6)  Redness or swelling where the shot was given (up to about 1 in 16 adolescents or 1 in 12 adults)  Fever over 102F (about 1 in 100 adolescents or 1 in 250 adults)  Headache (about 1 in 7 adolescents or 1 in 10 adults)  Nausea, vomiting, diarrhea, stomach ache (up to 1 or 3 people in 100)  Swelling of the entire arm where the shot was given (up to about 1 in 500).  Severe problems following Tdap: (Unable to perform usual activities; required medical attention)  Swelling, severe pain, bleeding and redness in the arm where the shot was given (rare).  Problems that could happen after any vaccine:  People sometimes faint after a medical procedure, including vaccination. Sitting or lying down for about 15 minutes can help prevent fainting, and injuries caused by a fall. Tell your doctor if you feel dizzy, or have vision changes or ringing in the ears.  Some people get severe pain in the shoulder and have difficulty moving the arm where a shot was given. This happens very  rarely.  Any medication can cause a severe allergic reaction. Such reactions from a vaccine are very rare, estimated at fewer than 1 in a million doses, and would happen within a few minutes to a few hours after the vaccination. As with any medicine, there is a very remote chance of a vaccine causing a serious injury or death. The safety of vaccines is always being monitored. For more information, visit: http://floyd.org/ 5. What if there is a serious problem? What should I look for? Look for anything that concerns you, such as signs of a severe allergic reaction, very high fever, or unusual behavior. Signs of a severe allergic reaction can include hives, swelling of the face and throat, difficulty breathing, a fast heartbeat, dizziness, and weakness. These would usually start a few minutes to a few hours after the vaccination. What should I do?  If you think it is a severe allergic reaction or other emergency that can't wait, call 9-1-1 or get the person to the nearest hospital. Otherwise, call your doctor.  Afterward, the reaction should be reported to the Vaccine Adverse Event Reporting System (VAERS). Your doctor might file this report, or you can do it yourself through the VAERS web site at www.vaers.LAgents.no, or by calling 1-336-185-9306. ? VAERS does not give medical advice. 6. The National Vaccine Injury Compensation Program The [employer] Vaccine Injury Compensation Program (VICP) is a federal program that was created to compensate people who may have been injured by certain vaccines. Persons who believe they may have been injured by a vaccine can learn about the program and about filing a claim by calling 1-701-680-8950 or visiting the VICP website at SpiritualWord.at. There is a time limit to file a claim for compensation. 7. How can I learn more?  Ask your doctor. He or she can give you the vaccine package insert or suggest other sources of information.  Call  your local or state health department.  Contact the Centers for Disease Control and Prevention (CDC): ? Call (564)461-1329 (1-800-CDC-INFO) or ? Visit CDC's website at PicCapture.uy CDC Tdap Vaccine VIS (02/14/14) This information is not intended to replace  advice given to you by your health care provider. Make sure you discuss any questions you have with your health care provider. Document Released: 06/08/2012 Document Revised: 08/28/2016 Document Reviewed: 08/28/2016 Elsevier Interactive Patient Education  2017 [employer].

## 2017-06-11 NOTE — Progress Notes (Signed)
Educated pt on [place] 

## 2017-07-02 ENCOUNTER — Ambulatory Visit (INDEPENDENT_AMBULATORY_CARE_PROVIDER_SITE_OTHER): Payer: Medicaid Other | Admitting: Obstetrics and Gynecology

## 2017-07-02 VITALS — BP 132/70 | HR 84 | Wt 172.0 lb

## 2017-07-02 DIAGNOSIS — O26893 Other specified pregnancy related conditions, third trimester: Secondary | ICD-10-CM

## 2017-07-02 DIAGNOSIS — IMO0002 Reserved for concepts with insufficient information to code with codable children: Secondary | ICD-10-CM

## 2017-07-02 DIAGNOSIS — Z8759 Personal history of other complications of pregnancy, childbirth and the puerperium: Secondary | ICD-10-CM

## 2017-07-02 DIAGNOSIS — Z23 Encounter for immunization: Secondary | ICD-10-CM

## 2017-07-02 DIAGNOSIS — Z6791 Unspecified blood type, Rh negative: Secondary | ICD-10-CM

## 2017-07-02 DIAGNOSIS — Z0489 Encounter for examination and observation for other specified reasons: Secondary | ICD-10-CM

## 2017-07-02 DIAGNOSIS — O09893 Supervision of other high risk pregnancies, third trimester: Secondary | ICD-10-CM

## 2017-07-02 DIAGNOSIS — Z3480 Encounter for supervision of other normal pregnancy, unspecified trimester: Secondary | ICD-10-CM

## 2017-07-02 MED ORDER — BECLOMETHASONE DIPROPIONATE 80 MCG/ACT IN AERS: 2.0000 | INHALATION_SPRAY | Freq: Two times a day (BID) | RESPIRATORY_TRACT | 12 refills | Status: DC

## 2017-07-02 MED ORDER — ALBUTEROL SULFATE HFA 108 (90 BASE) MCG/ACT IN AERS: 2.0000 | INHALATION_SPRAY | Freq: Four times a day (QID) | RESPIRATORY_TRACT | 2 refills | Status: DC | PRN

## 2017-07-02 MED ORDER — RHO D IMMUNE GLOBULIN 1500 UNIT/2ML IJ SOSY
300.0000 ug | PREFILLED_SYRINGE | Freq: Once | INTRAMUSCULAR | Status: AC
  Administered 2017-07-02: 300 ug via INTRAMUSCULAR

## 2017-07-02 NOTE — Progress Notes (Signed)
Prenatal Visit Note Date: 07/02/2017 Clinic: [facility]  Subjective:  Joanne Gross is a 31 y.o. G3P2002 at 7743w0d being seen today for ongoing prenatal care.  She is currently monitored for the following issues for this low-risk pregnancy and has Rh negative state in antepartum period, third trimester; Supervision of other normal pregnancy, antepartum; and History of gestational hypertension on her problem list.  Patient reports no complaints.   Contractions: Not present. Vag. Bleeding: None.  Movement: Present. Denies leaking of fluid.   The following portions of the patient's history were reviewed and updated as appropriate: allergies, current medications, past family history, past medical history, past social history, past surgical history and problem list. Problem list updated.  Objective:   Vitals:   07/02/17 0850  BP: 132/70  Pulse: 84  Weight: 172 lb (78 kg)    Fetal Status: Fetal Heart Rate (bpm): 150 Fundal Height: 28 cm Movement: Present     General:  Alert, oriented and cooperative. Patient is in no acute distress.  Skin: Skin is warm and dry. No rash noted.   Cardiovascular: Normal heart rate noted  Respiratory: Normal respiratory effort, no problems with respiration noted  Abdomen: Soft, gravid, appropriate for gestational age. Pain/Pressure: Present     Pelvic:  Cervical exam deferred        Extremities: Normal range of motion.  Edema: Trace  Mental Status: Normal mood and affect. Normal behavior. Normal judgment and thought content.   Urinalysis:      Assessment and Plan:  Pregnancy: G3P2002 at 5143w0d  1. Supervision of other normal pregnancy, antepartum Routine care. Completion anatomy scan ordered. D/w pt re: BC nv.  - CBC - RPR - HIV antibody (with reflex) - Glucose Tolerance, 2 Hours w/1 Hour - Tdap vaccine greater than or equal to 7yo IM - rho (d) immune globulin (RHIG/RHOPHYLAC) injection 300 mcg; Inject 2 mLs (300 mcg total)  into the muscle once.  2. Rh negative state in antepartum period, third trimester Rhogam today  Preterm labor symptoms and general obstetric precautions including but not limited to vaginal bleeding, contractions, leaking of fluid and fetal movement were reviewed in detail with the patient. Please refer to After Visit Summary for other counseling recommendations.  Return for 2-3wk rob.   Dawson BingPickens, Cyriah Childrey, MD

## 2017-07-02 NOTE — Progress Notes (Signed)
Tdap vaccine  28 wk packet given  Rhogam given  OB f/u US scheduled July 20th @ 1345.  Pt notified.  Educated pt on breastfeed for the first 6 months

## 2017-07-02 NOTE — Patient Instructions (Signed)
Take the qvar two puffs twice a day even if you are feeling well  Take the albuterol as needed. If you are needing it more than 3-4x per week, call us so we can adjust your qvar medication  Continue to take the zyrtec.

## 2017-07-03 LAB — CBC
HEMOGLOBIN: 11.5 g/dL (ref 11.1–15.9)
Hematocrit: 34.3 % (ref 34.0–46.6)
MCH: 28.1 pg (ref 26.6–33.0)
MCHC: 33.5 g/dL (ref 31.5–35.7)
MCV: 84 fL (ref 79–97)
Platelets: 267 10*3/uL (ref 150–379)
RBC: 4.09 x10E6/uL (ref 3.77–5.28)
RDW: 14.3 % (ref 12.3–15.4)
WBC: 11.6 10*3/uL — ABNORMAL HIGH (ref 3.4–10.8)

## 2017-07-03 LAB — HIV ANTIBODY (ROUTINE TESTING W REFLEX): HIV SCREEN 4TH GENERATION: NONREACTIVE

## 2017-07-03 LAB — GLUCOSE TOLERANCE, 2 HOURS W/ 1HR
GLUCOSE, 1 HOUR: 148 mg/dL (ref 65–179)
GLUCOSE, 2 HOUR: 124 mg/dL (ref 65–152)
Glucose, Fasting: 81 mg/dL (ref 65–91)

## 2017-07-03 LAB — RPR: RPR: NONREACTIVE

## 2017-07-10 ENCOUNTER — Ambulatory Visit (HOSPITAL_COMMUNITY)
Admission: RE | Admit: 2017-07-10 | Discharge: 2017-07-10 | Disposition: A | Discharge status: Home / Self Care | Payer: Medicaid Other | Source: Ambulatory Visit | Attending: Obstetrics and Gynecology | Admitting: Obstetrics and Gynecology

## 2017-07-10 DIAGNOSIS — Z362 Encounter for other antenatal screening follow-up: Secondary | ICD-10-CM | POA: Insufficient documentation

## 2017-07-10 DIAGNOSIS — O0932 Supervision of pregnancy with insufficient antenatal care, second trimester: Secondary | ICD-10-CM | POA: Diagnosis not present

## 2017-07-10 DIAGNOSIS — Z3A29 29 weeks gestation of pregnancy: Secondary | ICD-10-CM | POA: Insufficient documentation

## 2017-07-10 DIAGNOSIS — Z0489 Encounter for examination and observation for other specified reasons: Secondary | ICD-10-CM

## 2017-07-10 DIAGNOSIS — Z8759 Personal history of other complications of pregnancy, childbirth and the puerperium: Secondary | ICD-10-CM

## 2017-07-10 DIAGNOSIS — IMO0002 Reserved for concepts with insufficient information to code with codable children: Secondary | ICD-10-CM

## 2017-07-23 ENCOUNTER — Ambulatory Visit (INDEPENDENT_AMBULATORY_CARE_PROVIDER_SITE_OTHER): Payer: Medicaid Other | Admitting: Advanced Practice Midwife

## 2017-07-23 ENCOUNTER — Encounter: Payer: Self-pay | Admitting: Advanced Practice Midwife

## 2017-07-23 DIAGNOSIS — Z3483 Encounter for supervision of other normal pregnancy, third trimester: Secondary | ICD-10-CM

## 2017-07-23 DIAGNOSIS — Z3480 Encounter for supervision of other normal pregnancy, unspecified trimester: Secondary | ICD-10-CM

## 2017-07-23 NOTE — Patient Instructions (Signed)
Third Trimester of Pregnancy The third trimester is from week 28 through week 40 (months 7 through 9). The third trimester is a time when the unborn baby (fetus) is growing rapidly. At the end of the ninth month, the fetus is about 20 inches in length and weighs 6-10 pounds. Body changes during your third trimester Your body will continue to go through many changes during pregnancy. The changes vary from woman to woman. During the third trimester:  Your weight will continue to increase. You can expect to gain 25-35 pounds (11-16 kg) by the end of the pregnancy.  You may begin to get stretch marks on your hips, abdomen, and breasts.  You may urinate more often because the fetus is moving lower into your pelvis and pressing on your bladder.  You may develop or continue to have heartburn. This is caused by increased hormones that slow down muscles in the digestive tract.  You may develop or continue to have constipation because increased hormones slow digestion and cause the muscles that push waste through your intestines to relax.  You may develop hemorrhoids. These are swollen veins (varicose veins) in the rectum that can itch or be painful.  You may develop swollen, bulging veins (varicose veins) in your legs.  You may have increased body aches in the pelvis, back, or thighs. This is due to weight gain and increased hormones that are relaxing your joints.  You may have changes in your hair. These can include thickening of your hair, rapid growth, and changes in texture. Some women also have hair loss during or after pregnancy, or hair that feels dry or thin. Your hair will most likely return to normal after your baby is born.  Your breasts will continue to grow and they will continue to become tender. A yellow fluid (colostrum) may leak from your breasts. This is the first milk you are producing for your baby.  Your belly button may stick out.  You may notice more swelling in your hands,  face, or ankles.  You may have increased tingling or numbness in your hands, arms, and legs. The skin on your belly may also feel numb.  You may feel short of breath because of your expanding uterus.  You may have more problems sleeping. This can be caused by the size of your belly, increased need to urinate, and an increase in your body's metabolism.  You may notice the fetus "dropping," or moving lower in your abdomen (lightening).  You may have increased vaginal discharge.  You may notice your joints feel loose and you may have pain around your pelvic bone.  What to expect at prenatal visits You will have prenatal exams every 2 weeks until week 36. Then you will have weekly prenatal exams. During a routine prenatal visit:  You will be weighed to make sure you and the baby are growing normally.  Your blood pressure will be taken.  Your abdomen will be measured to track your baby's growth.  The fetal heartbeat will be listened to.  Any test results from the previous visit will be discussed.  You may have a cervical check near your due date to see if your cervix has softened or thinned (effaced).  You will be tested for Group B streptococcus. This happens between 35 and 37 weeks.  Your health care provider may ask you:  What your birth plan is.  How you are feeling.  If you are feeling the baby move.  If you have had   any abnormal symptoms, such as leaking fluid, bleeding, severe headaches, or abdominal cramping.  If you are using any tobacco products, including cigarettes, chewing tobacco, and electronic cigarettes.  If you have any questions.  Other tests or screenings that may be performed during your third trimester include:  Blood tests that check for low iron levels (anemia).  Fetal testing to check the health, activity level, and growth of the fetus. Testing is done if you have certain medical conditions or if there are problems during the  pregnancy.  Nonstress test (NST). This test checks the health of your baby to make sure there are no signs of problems, such as the baby not getting enough oxygen. During this test, a belt is placed around your belly. The baby is made to move, and its heart rate is monitored during movement.  What is false labor? False labor is a condition in which you feel small, irregular tightenings of the muscles in the womb (contractions) that usually go away with rest, changing position, or drinking water. These are called Braxton Hicks contractions. Contractions may last for hours, days, or even weeks before true labor sets in. If contractions come at regular intervals, become more frequent, increase in intensity, or become painful, you should see your health care provider. What are the signs of labor?  Abdominal cramps.  Regular contractions that start at 10 minutes apart and become stronger and more frequent with time.  Contractions that start on the top of the uterus and spread down to the lower abdomen and back.  Increased pelvic pressure and dull back pain.  A watery or bloody mucus discharge that comes from the vagina.  Leaking of amniotic fluid. This is also known as your "water breaking." It could be a slow trickle or a gush. Let your health care provider know if it has a color or strange odor. If you have any of these signs, call your health care provider right away, even if it is before your due date. Follow these instructions at home: Medicines  Follow your health care provider's instructions regarding medicine use. Specific medicines may be either safe or unsafe to take during pregnancy.  Take a prenatal vitamin that contains at least 600 micrograms (mcg) of folic acid.  If you develop constipation, try taking a stool softener if your health care provider approves. Eating and drinking  Eat a balanced diet that includes fresh fruits and vegetables, whole grains, good sources of protein  such as meat, eggs, or tofu, and low-fat dairy. Your health care provider will help you determine the amount of weight gain that is right for you.  Avoid raw meat and uncooked cheese. These carry germs that can cause birth defects in the baby.  If you have low calcium intake from food, talk to your health care provider about whether you should take a daily calcium supplement.  Eat four or five small meals rather than three large meals a day.  Limit foods that are high in fat and processed sugars, such as fried and sweet foods.  To prevent constipation: ? Drink enough fluid to keep your urine clear or pale yellow. ? Eat foods that are high in fiber, such as fresh fruits and vegetables, whole grains, and beans. Activity  Exercise only as directed by your health care provider. Most women can continue their usual exercise routine during pregnancy. Try to exercise for 30 minutes at least 5 days a week. Stop exercising if you experience uterine contractions.  Avoid heavy   lifting.  Do not exercise in extreme heat or humidity, or at high altitudes.  Wear low-heel, comfortable shoes.  Practice good posture.  You may continue to have sex unless your health care provider tells you otherwise. Relieving pain and discomfort  Take frequent breaks and rest with your legs elevated if you have leg cramps or low back pain.  Take warm sitz baths to soothe any pain or discomfort caused by hemorrhoids. Use hemorrhoid cream if your health care provider approves.  Wear a good support bra to prevent discomfort from breast tenderness.  If you develop varicose veins: ? Wear support pantyhose or compression stockings as told by your healthcare provider. ? Elevate your feet for 15 minutes, 3-4 times a day. Prenatal care  Write down your questions. Take them to your prenatal visits.  Keep all your prenatal visits as told by your health care provider. This is important. Safety  Wear your seat belt at  all times when driving.  Make a list of emergency phone numbers, including numbers for family, friends, the hospital, and police and fire departments. General instructions  Avoid cat litter boxes and soil used by cats. These carry germs that can cause birth defects in the baby. If you have a cat, ask someone to clean the litter box for you.  Do not travel far distances unless it is absolutely necessary and only with the approval of your health care provider.  Do not use hot tubs, steam rooms, or saunas.  Do not drink alcohol.  Do not use any products that contain nicotine or tobacco, such as cigarettes and e-cigarettes. If you need help quitting, ask your health care provider.  Do not use any medicinal herbs or unprescribed drugs. These chemicals affect the formation and growth of the baby.  Do not douche or use tampons or scented sanitary pads.  Do not cross your legs for long periods of time.  To prepare for the arrival of your baby: ? Take prenatal classes to understand, practice, and ask questions about labor and delivery. ? Make a trial run to the hospital. ? Visit the hospital and tour the maternity area. ? Arrange for maternity or paternity leave through employers. ? Arrange for family and friends to take care of pets while you are in the hospital. ? Purchase a rear-facing car seat and make sure you know how to install it in your car. ? Pack your hospital bag. ? Prepare the baby's nursery. Make sure to remove all pillows and stuffed animals from the baby's crib to prevent suffocation.  Visit your dentist if you have not gone during your pregnancy. Use a soft toothbrush to brush your teeth and be gentle when you floss. Contact a health care provider if:  You are unsure if you are in labor or if your water has broken.  You become dizzy.  You have mild pelvic cramps, pelvic pressure, or nagging pain in your abdominal area.  You have lower back pain.  You have persistent  nausea, vomiting, or diarrhea.  You have an unusual or bad smelling vaginal discharge.  You have pain when you urinate. Get help right away if:  Your water breaks before 37 weeks.  You have regular contractions less than 5 minutes apart before 37 weeks.  You have a fever.  You are leaking fluid from your vagina.  You have spotting or bleeding from your vagina.  You have severe abdominal pain or cramping.  You have rapid weight loss or weight gain.    You have shortness of breath with chest pain.  You notice sudden or extreme swelling of your face, hands, ankles, feet, or legs.  Your baby makes fewer than 10 movements in 2 hours.  You have severe headaches that do not go away when you take medicine.  You have vision changes. Summary  The third trimester is from week 28 through week 40, months 7 through 9. The third trimester is a time when the unborn baby (fetus) is growing rapidly.  During the third trimester, your discomfort may increase as you and your baby continue to gain weight. You may have abdominal, leg, and back pain, sleeping problems, and an increased need to urinate.  During the third trimester your breasts will keep growing and they will continue to become tender. A yellow fluid (colostrum) may leak from your breasts. This is the first milk you are producing for your baby.  False labor is a condition in which you feel small, irregular tightenings of the muscles in the womb (contractions) that eventually go away. These are called Braxton Hicks contractions. Contractions may last for hours, days, or even weeks before true labor sets in.  Signs of labor can include: abdominal cramps; regular contractions that start at 10 minutes apart and become stronger and more frequent with time; watery or bloody mucus discharge that comes from the vagina; increased pelvic pressure and dull back pain; and leaking of amniotic fluid. This information is not intended to replace advice  given to you by your health care provider. Make sure you discuss any questions you have with your health care provider. Document Released: 12/02/2001 Document Revised: 05/15/2016 Document Reviewed: 02/08/2013 Elsevier Interactive Patient Education  2017 [employer]  

## 2017-07-23 NOTE — Progress Notes (Signed)
   PRENATAL VISIT NOTE  Subjective:  Joanne Gross is a 31 y.o. G3P2002 at 1654w0d being seen today for ongoing prenatal care.  She is currently monitored for the following issues for this low-risk pregnancy and has Rh negative state in antepartum period, third trimester; Supervision of other normal pregnancy, antepartum; and History of gestational hypertension on her problem list.  Patient reports no complaints.  Contractions: Not present. Vag. Bleeding: None.  Movement: Present. Denies leaking of fluid.   The following portions of the patient's history were reviewed and updated as appropriate: allergies, current medications, past family history, past medical history, past social history, past surgical history and problem list. Problem list updated.  Objective:   Vitals:   07/23/17 1026  BP: 129/81  Pulse: (!) 108  Weight: 177 lb 12.8 oz (80.6 kg)    Fetal Status: Fetal Heart Rate (bpm): 140   Movement: Present     General:  Alert, oriented and cooperative. Patient is in no acute distress.  Skin: Skin is warm and dry. No rash noted.   Cardiovascular: Normal heart rate noted  Respiratory: Normal respiratory effort, no problems with respiration noted  Abdomen: Soft, gravid, appropriate for gestational age.  Pain/Pressure: Present     Pelvic: Cervical exam deferred        Extremities: Normal range of motion.  Edema: None  Mental Status:  Normal mood and affect. Normal behavior. Normal judgment and thought content.   Assessment and Plan:  Pregnancy: G3P2002 at 4154w0d  1. Supervision of other normal pregnancy, antepartum --Anticipatory guidance about next weeks of pregnancy and upcoming visits.      Preterm labor symptoms and general obstetric precautions including but not limited to vaginal bleeding, contractions, leaking of fluid and fetal movement were reviewed in detail with the patient. Please refer to After Visit Summary for other counseling recommendations.  No Follow-up on  file.   Sharen CounterLisa Leftwich-Kirby, CNM

## 2017-08-06 ENCOUNTER — Encounter: Payer: Medicaid Other | Admitting: Family Medicine

## 2017-08-06 ENCOUNTER — Ambulatory Visit (INDEPENDENT_AMBULATORY_CARE_PROVIDER_SITE_OTHER): Payer: Medicaid Other | Admitting: Medical

## 2017-08-06 DIAGNOSIS — Z3480 Encounter for supervision of other normal pregnancy, unspecified trimester: Secondary | ICD-10-CM

## 2017-08-06 DIAGNOSIS — Z3483 Encounter for supervision of other normal pregnancy, third trimester: Secondary | ICD-10-CM

## 2017-08-06 NOTE — Progress Notes (Signed)
   PRENATAL VISIT NOTE  Subjective:  Joanne Gross is a 31 y.o. G3P2002 at 814w0d being seen today for ongoing prenatal care.  She is currently monitored for the following issues for this low-risk pregnancy and has Rh negative state in antepartum period, third trimester; Supervision of other normal pregnancy, antepartum; and History of gestational hypertension on her problem list.  Patient reports no complaints.  Contractions: Not present. Vag. Bleeding: None.  Movement: Present. Denies leaking of fluid.   The following portions of the patient's history were reviewed and updated as appropriate: allergies, current medications, past family history, past medical history, past social history, past surgical history and problem list. Problem list updated.  Objective:   Vitals:   08/06/17 1545  BP: 128/74  Pulse: (!) 103  Weight: 178 lb 6.4 oz (80.9 kg)    Fetal Status: Fetal Heart Rate (bpm): 150 Fundal Height: 34 cm Movement: Present     General:  Alert, oriented and cooperative. Patient is in no acute distress.  Skin: Skin is warm and dry. No rash noted.   Cardiovascular: Normal heart rate noted  Respiratory: Normal respiratory effort, no problems with respiration noted  Abdomen: Soft, gravid, appropriate for gestational age.  Pain/Pressure: Present     Pelvic: Cervical exam deferred        Extremities: Normal range of motion.  Edema: None  Mental Status:  Normal mood and affect. Normal behavior. Normal judgment and thought content.   Assessment and Plan:  Pregnancy: G3P2002 at 314w0d  1. Supervision of other normal pregnancy, antepartum - Doing well, no complaints  Preterm labor symptoms and general obstetric precautions including but not limited to vaginal bleeding, contractions, leaking of fluid and fetal movement were reviewed in detail with the patient. Please refer to After Visit Summary for other counseling recommendations.  Return in about 2 weeks (around 08/20/2017) for  LOB.   Vonzella NippleJulie Wenzel, PA-C

## 2017-08-06 NOTE — Patient Instructions (Signed)
Fetal Movement Counts °Patient Name: ________________________________________________ Patient Due Date: ____________________ °What is a fetal movement count? °A fetal movement count is the number of times that you feel your baby move during a certain amount of time. This may also be called a fetal kick count. A fetal movement count is recommended for every pregnant woman. You may be asked to start counting fetal movements as early as week 28 of your pregnancy. °Pay attention to when your baby is most active. You may notice your baby's sleep and wake cycles. You may also notice things that make your baby move more. You should do a fetal movement count: °· When your baby is normally most active. °· At the same time each day. ° °A good time to count movements is while you are resting, after having something to eat and drink. °How do I count fetal movements? °1. Find a quiet, comfortable area. Sit, or lie down on your side. °2. Write down the date, the start time and stop time, and the number of movements that you felt between those two times. Take this information with you to your health care visits. °3. For 2 hours, count kicks, flutters, swishes, rolls, and jabs. You should feel at least 10 movements during 2 hours. °4. You may stop counting after you have felt 10 movements. °5. If you do not feel 10 movements in 2 hours, have something to eat and drink. Then, keep resting and counting for 1 hour. If you feel at least 4 movements during that hour, you may stop counting. °Contact a health care provider if: °· You feel fewer than 4 movements in 2 hours. °· Your baby is not moving like he or she usually does. °Date: ____________ Start time: ____________ Stop time: ____________ Movements: ____________ °Date: ____________ Start time: ____________ Stop time: ____________ Movements: ____________ °Date: ____________ Start time: ____________ Stop time: ____________ Movements: ____________ °Date: ____________ Start time:  ____________ Stop time: ____________ Movements: ____________ °Date: ____________ Start time: ____________ Stop time: ____________ Movements: ____________ °Date: ____________ Start time: ____________ Stop time: ____________ Movements: ____________ °Date: ____________ Start time: ____________ Stop time: ____________ Movements: ____________ °Date: ____________ Start time: ____________ Stop time: ____________ Movements: ____________ °Date: ____________ Start time: ____________ Stop time: ____________ Movements: ____________ °This information is not intended to replace advice given to you by your health care provider. Make sure you discuss any questions you have with your health care provider. °Document Released: 01/07/2007 Document Revised: 08/06/2016 Document Reviewed: 01/17/2016 °Elsevier Interactive Patient Education © 2018 [employer] °Braxton Hicks Contractions °Contractions of the uterus can occur throughout pregnancy, but they are not always a sign that you are in labor. You may have practice contractions called Braxton Hicks contractions. These false labor contractions are sometimes confused with true labor. °What are Braxton Hicks contractions? °Braxton Hicks contractions are tightening movements that occur in the muscles of the uterus before labor. Unlike true labor contractions, these contractions do not result in opening (dilation) and thinning of the cervix. Toward the end of pregnancy (32-34 weeks), Braxton Hicks contractions can happen more often and may become stronger. These contractions are sometimes difficult to tell apart from true labor because they can be very uncomfortable. You should not feel embarrassed if you go to the hospital with false labor. °Sometimes, the only way to tell if you are in true labor is for your health care provider to look for changes in the cervix. The health care provider will do a physical exam and may monitor your contractions. If   you are not in true labor, the exam  should show that your cervix is not dilating and your water has not broken. °If there are no prenatal problems or other health problems associated with your pregnancy, it is completely safe for you to be sent home with false labor. You may continue to have Braxton Hicks contractions until you go into true labor. °How can I tell the difference between true labor and false labor? °· Differences °? False labor °? Contractions last 30-70 seconds.: Contractions are usually shorter and not as strong as true labor contractions. °? Contractions become very regular.: Contractions are usually irregular. °? Discomfort is usually felt in the top of the uterus, and it spreads to the lower abdomen and low back.: Contractions are often felt in the front of the lower abdomen and in the groin. °? Contractions do not go away with walking.: Contractions may go away when you walk around or change positions while lying down. °? Contractions usually become more intense and increase in frequency.: Contractions get weaker and are shorter-lasting as time goes on. °? The cervix dilates and gets thinner.: The cervix usually does not dilate or become thin. °Follow these instructions at home: °· Take over-the-counter and prescription medicines only as told by your health care provider. °· Keep up with your usual exercises and follow other instructions from your health care provider. °· Eat and drink lightly if you think you are going into labor. °· If Braxton Hicks contractions are making you uncomfortable: °? Change your position from lying down or resting to walking, or change from walking to resting. °? Sit and rest in a tub of warm water. °? Drink enough fluid to keep your urine clear or pale yellow. Dehydration may cause these contractions. °? Do slow and deep breathing several times an hour. °· Keep all follow-up prenatal visits as told by your health care provider. This is important. °Contact a health care provider if: °· You have a  fever. °· You have continuous pain in your abdomen. °Get help right away if: °· Your contractions become stronger, more regular, and closer together. °· You have fluid leaking or gushing from your vagina. °· You pass blood-tinged mucus (bloody show). °· You have bleeding from your vagina. °· You have low back pain that you never had before. °· You feel your baby’s head pushing down and causing pelvic pressure. °· Your baby is not moving inside you as much as it used to. °Summary °· Contractions that occur before labor are called Braxton Hicks contractions, false labor, or practice contractions. °· Braxton Hicks contractions are usually shorter, weaker, farther apart, and less regular than true labor contractions. True labor contractions usually become progressively stronger and regular and they become more frequent. °· Manage discomfort from Braxton Hicks contractions by changing position, resting in a warm bath, drinking plenty of water, or practicing deep breathing. °This information is not intended to replace advice given to you by your health care provider. Make sure you discuss any questions you have with your health care provider. °Document Released: 12/08/2005 Document Revised: 10/27/2016 Document Reviewed: 10/27/2016 °Elsevier Interactive Patient Education © 2017 [employer] ° °

## 2017-08-19 ENCOUNTER — Encounter: Payer: Medicaid Other | Admitting: Advanced Practice Midwife

## 2017-08-26 ENCOUNTER — Other Ambulatory Visit (HOSPITAL_COMMUNITY)
Admission: RE | Admit: 2017-08-26 | Discharge: 2017-08-26 | Disposition: A | Discharge status: Home / Self Care | Payer: Medicaid Other | Source: Ambulatory Visit | Attending: Medical | Admitting: Medical

## 2017-08-26 ENCOUNTER — Ambulatory Visit (INDEPENDENT_AMBULATORY_CARE_PROVIDER_SITE_OTHER): Payer: Medicaid Other | Admitting: Medical

## 2017-08-26 VITALS — BP 135/85 | HR 103 | Wt 181.0 lb

## 2017-08-26 DIAGNOSIS — Z3480 Encounter for supervision of other normal pregnancy, unspecified trimester: Secondary | ICD-10-CM | POA: Insufficient documentation

## 2017-08-26 DIAGNOSIS — Z3483 Encounter for supervision of other normal pregnancy, third trimester: Secondary | ICD-10-CM | POA: Diagnosis present

## 2017-08-26 DIAGNOSIS — Z113 Encounter for screening for infections with a predominantly sexual mode of transmission: Secondary | ICD-10-CM

## 2017-08-26 DIAGNOSIS — O26893 Other specified pregnancy related conditions, third trimester: Principal | ICD-10-CM

## 2017-08-26 DIAGNOSIS — Z8759 Personal history of other complications of pregnancy, childbirth and the puerperium: Secondary | ICD-10-CM

## 2017-08-26 DIAGNOSIS — Z6791 Unspecified blood type, Rh negative: Secondary | ICD-10-CM

## 2017-08-26 DIAGNOSIS — Z331 Pregnant state, incidental: Secondary | ICD-10-CM

## 2017-08-26 NOTE — Patient Instructions (Signed)
Fetal Movement Counts °Patient Name: ________________________________________________ Patient Due Date: ____________________ °What is a fetal movement count? °A fetal movement count is the number of times that you feel your baby move during a certain amount of time. This may also be called a fetal kick count. A fetal movement count is recommended for every pregnant woman. You may be asked to start counting fetal movements as early as week 28 of your pregnancy. °Pay attention to when your baby is most active. You may notice your baby's sleep and wake cycles. You may also notice things that make your baby move more. You should do a fetal movement count: °· When your baby is normally most active. °· At the same time each day. ° °A good time to count movements is while you are resting, after having something to eat and drink. °How do I count fetal movements? °1. Find a quiet, comfortable area. Sit, or lie down on your side. °2. Write down the date, the start time and stop time, and the number of movements that you felt between those two times. Take this information with you to your health care visits. °3. For 2 hours, count kicks, flutters, swishes, rolls, and jabs. You should feel at least 10 movements during 2 hours. °4. You may stop counting after you have felt 10 movements. °5. If you do not feel 10 movements in 2 hours, have something to eat and drink. Then, keep resting and counting for 1 hour. If you feel at least 4 movements during that hour, you may stop counting. °Contact a health care provider if: °· You feel fewer than 4 movements in 2 hours. °· Your baby is not moving like he or she usually does. °Date: ____________ Start time: ____________ Stop time: ____________ Movements: ____________ °Date: ____________ Start time: ____________ Stop time: ____________ Movements: ____________ °Date: ____________ Start time: ____________ Stop time: ____________ Movements: ____________ °Date: ____________ Start time:  ____________ Stop time: ____________ Movements: ____________ °Date: ____________ Start time: ____________ Stop time: ____________ Movements: ____________ °Date: ____________ Start time: ____________ Stop time: ____________ Movements: ____________ °Date: ____________ Start time: ____________ Stop time: ____________ Movements: ____________ °Date: ____________ Start time: ____________ Stop time: ____________ Movements: ____________ °Date: ____________ Start time: ____________ Stop time: ____________ Movements: ____________ °This information is not intended to replace advice given to you by your health care provider. Make sure you discuss any questions you have with your health care provider. °Document Released: 01/07/2007 Document Revised: 08/06/2016 Document Reviewed: 01/17/2016 °Elsevier Interactive Patient Education © 2018 [employer] °Braxton Hicks Contractions °Contractions of the uterus can occur throughout pregnancy, but they are not always a sign that you are in labor. You may have practice contractions called Braxton Hicks contractions. These false labor contractions are sometimes confused with true labor. °What are Braxton Hicks contractions? °Braxton Hicks contractions are tightening movements that occur in the muscles of the uterus before labor. Unlike true labor contractions, these contractions do not result in opening (dilation) and thinning of the cervix. Toward the end of pregnancy (32-34 weeks), Braxton Hicks contractions can happen more often and may become stronger. These contractions are sometimes difficult to tell apart from true labor because they can be very uncomfortable. You should not feel embarrassed if you go to the hospital with false labor. °Sometimes, the only way to tell if you are in true labor is for your health care provider to look for changes in the cervix. The health care provider will do a physical exam and may monitor your contractions. If   you are not in true labor, the exam  should show that your cervix is not dilating and your water has not broken. °If there are no prenatal problems or other health problems associated with your pregnancy, it is completely safe for you to be sent home with false labor. You may continue to have Braxton Hicks contractions until you go into true labor. °How can I tell the difference between true labor and false labor? °· Differences °? False labor °? Contractions last 30-70 seconds.: Contractions are usually shorter and not as strong as true labor contractions. °? Contractions become very regular.: Contractions are usually irregular. °? Discomfort is usually felt in the top of the uterus, and it spreads to the lower abdomen and low back.: Contractions are often felt in the front of the lower abdomen and in the groin. °? Contractions do not go away with walking.: Contractions may go away when you walk around or change positions while lying down. °? Contractions usually become more intense and increase in frequency.: Contractions get weaker and are shorter-lasting as time goes on. °? The cervix dilates and gets thinner.: The cervix usually does not dilate or become thin. °Follow these instructions at home: °· Take over-the-counter and prescription medicines only as told by your health care provider. °· Keep up with your usual exercises and follow other instructions from your health care provider. °· Eat and drink lightly if you think you are going into labor. °· If Braxton Hicks contractions are making you uncomfortable: °? Change your position from lying down or resting to walking, or change from walking to resting. °? Sit and rest in a tub of warm water. °? Drink enough fluid to keep your urine clear or pale yellow. Dehydration may cause these contractions. °? Do slow and deep breathing several times an hour. °· Keep all follow-up prenatal visits as told by your health care provider. This is important. °Contact a health care provider if: °· You have a  fever. °· You have continuous pain in your abdomen. °Get help right away if: °· Your contractions become stronger, more regular, and closer together. °· You have fluid leaking or gushing from your vagina. °· You pass blood-tinged mucus (bloody show). °· You have bleeding from your vagina. °· You have low back pain that you never had before. °· You feel your baby’s head pushing down and causing pelvic pressure. °· Your baby is not moving inside you as much as it used to. °Summary °· Contractions that occur before labor are called Braxton Hicks contractions, false labor, or practice contractions. °· Braxton Hicks contractions are usually shorter, weaker, farther apart, and less regular than true labor contractions. True labor contractions usually become progressively stronger and regular and they become more frequent. °· Manage discomfort from Braxton Hicks contractions by changing position, resting in a warm bath, drinking plenty of water, or practicing deep breathing. °This information is not intended to replace advice given to you by your health care provider. Make sure you discuss any questions you have with your health care provider. °Document Released: 12/08/2005 Document Revised: 10/27/2016 Document Reviewed: 10/27/2016 °Elsevier Interactive Patient Education © 2017 [employer] ° °

## 2017-08-26 NOTE — Progress Notes (Signed)
   PRENATAL VISIT NOTE  Subjective:  Joanne Gross is a 31 y.o. G3P2002 at 2227w6d being seen today for ongoing prenatal care.  She is currently monitored for the following issues for this low-risk pregnancy and has Rh negative state in antepartum period, third trimester; Supervision of other normal pregnancy, antepartum; and History of gestational hypertension on her problem list.  Patient reports no complaints.  Contractions: Irregular. Vag. Bleeding: None.  Movement: Present. Denies leaking of fluid.   The following portions of the patient's history were reviewed and updated as appropriate: allergies, current medications, past family history, past medical history, past social history, past surgical history and problem list. Problem list updated.  Objective:   Vitals:   08/26/17 1403  BP: 135/85  Pulse: (!) 103  Weight: 181 lb (82.1 kg)    Fetal Status: Fetal Heart Rate (bpm): 152 Fundal Height: 36 cm Movement: Present  Presentation: Vertex  General:  Alert, oriented and cooperative. Patient is in no acute distress.  Skin: Skin is warm and dry. No rash noted.   Cardiovascular: Normal heart rate noted  Respiratory: Normal respiratory effort, no problems with respiration noted  Abdomen: Soft, gravid, appropriate for gestational age.  Pain/Pressure: Present     Pelvic: Cervical exam performed Dilation: Closed Effacement (%): 50 Station: -2  Extremities: Normal range of motion.  Edema: None  Mental Status:  Normal mood and affect. Normal behavior. Normal judgment and thought content.   Assessment and Plan:  Pregnancy: G3P2002 at 7327w6d  1. Rh negative state in antepartum period, third trimester - Received Rhogam 05/09/17  2. Supervision of other normal pregnancy, antepartum - Culture, beta strep (group b only) - Cervicovaginal ancillary only  3. History of gestational hypertension - Normotensive today - Warning signs reviewed for GHTN/pre-eclampsia  Preterm labor symptoms and  general obstetric precautions including but not limited to vaginal bleeding, contractions, leaking of fluid and fetal movement were reviewed in detail with the patient. Please refer to After Visit Summary for other counseling recommendations.  Return in about 1 week (around 09/02/2017) for LOB.   Vonzella NippleJulie Yue Glasheen, PA-C

## 2017-08-27 LAB — CERVICOVAGINAL ANCILLARY ONLY
CHLAMYDIA, DNA PROBE: NEGATIVE
NEISSERIA GONORRHEA: NEGATIVE

## 2017-09-01 LAB — CULTURE, BETA STREP (GROUP B ONLY): STREP GP B CULTURE: NEGATIVE

## 2017-09-04 ENCOUNTER — Ambulatory Visit (INDEPENDENT_AMBULATORY_CARE_PROVIDER_SITE_OTHER): Payer: Medicaid Other | Admitting: Obstetrics and Gynecology

## 2017-09-04 VITALS — BP 123/78 | HR 89 | Wt 180.8 lb

## 2017-09-04 DIAGNOSIS — Z3483 Encounter for supervision of other normal pregnancy, third trimester: Secondary | ICD-10-CM

## 2017-09-04 DIAGNOSIS — O26893 Other specified pregnancy related conditions, third trimester: Secondary | ICD-10-CM

## 2017-09-04 DIAGNOSIS — Z3480 Encounter for supervision of other normal pregnancy, unspecified trimester: Secondary | ICD-10-CM

## 2017-09-04 DIAGNOSIS — Z6791 Unspecified blood type, Rh negative: Secondary | ICD-10-CM

## 2017-09-04 NOTE — Progress Notes (Signed)
   PRENATAL VISIT NOTE  Subjective:  Joanne Gross is a 31 y.o. G3P2002 at [redacted]w[redacted]d being seen today for ongoing prenatal care.  She is currently monitored for the following issues for this low-risk pregnancy and has Rh negative state in antepartum period, third trimester; Supervision of other normal pregnancy, antepartum; and History of gestational hypertension on her problem list.  Patient reports no complaints.  Contractions: Not present. Vag. Bleeding: None.  Movement: Present. Denies leaking of fluid.   The following portions of the patient's history were reviewed and updated as appropriate: allergies, current medications, past family history, past medical history, past social history, past surgical history and problem list. Problem list updated.  Objective:   Vitals:   09/04/17 1052  BP: 123/78  Pulse: 89  Weight: 180 lb 12.8 oz (82 kg)    Fetal Status: Fetal Heart Rate (bpm): 148 Fundal Height: 37 cm Movement: Present     General:  Alert, oriented and cooperative. Patient is in no acute distress.  Skin: Skin is warm and dry. No rash noted.   Cardiovascular: Normal heart rate noted  Respiratory: Normal respiratory effort, no problems with respiration noted  Abdomen: Soft, gravid, appropriate for gestational age.  Pain/Pressure: Present     Pelvic: Cervical exam deferred        Extremities: Normal range of motion.  Edema: Trace  Mental Status:  Normal mood and affect. Normal behavior. Normal judgment and thought content.   Assessment and Plan:  Pregnancy: G3P2002 at [redacted]w[redacted]d  1. Supervision of other normal pregnancy, antepartum Patient is doing well without complaints Preeclampsia precautions reviewed  2. Rh negative state in antepartum period, third trimester S/p rhogam  Term labor symptoms and general obstetric precautions including but not limited to vaginal bleeding, contractions, leaking of fluid and fetal movement were reviewed in detail with the patient. Please refer  to After Visit Summary for other counseling recommendations.  Return in about 1 week (around 09/11/2017) for ROB.   Catalina Antigua, MD

## 2017-09-14 ENCOUNTER — Ambulatory Visit (INDEPENDENT_AMBULATORY_CARE_PROVIDER_SITE_OTHER): Payer: Medicaid Other | Admitting: Certified Nurse Midwife

## 2017-09-14 DIAGNOSIS — Z3480 Encounter for supervision of other normal pregnancy, unspecified trimester: Secondary | ICD-10-CM

## 2017-09-14 NOTE — Progress Notes (Signed)
   PRENATAL VISIT NOTE  Subjective:  Joanne Gross is a 31 y.o. G3P2002 at [redacted]w[redacted]d being seen today for ongoing prenatal care.  She is currently monitored for the following issues for this low-risk pregnancy and has Rh negative state in antepartum period, third trimester; Supervision of other normal pregnancy, antepartum; and History of gestational hypertension on her problem list.  Patient reports no complaints.  Contractions: Not present.  .  Movement: Present. Denies leaking of fluid.   The following portions of the patient's history were reviewed and updated as appropriate: allergies, current medications, past family history, past medical history, past social history, past surgical history and problem list. Problem list updated.  Objective:   Vitals:   09/14/17 1027  BP: 133/84  Pulse: (!) 103  Weight: 183 lb (83 kg)    Fetal Status: Fetal Heart Rate (bpm): 132 Fundal Height: 39 cm Movement: Present     General:  Alert, oriented and cooperative. Patient is in no acute distress.  Skin: Skin is warm and dry. No rash noted.   Cardiovascular: elevated heart rate noted  Respiratory: Normal respiratory effort, no problems with respiration noted  Abdomen: Soft, gravid, appropriate for gestational age.  Pain/Pressure: Present     Pelvic: Cervical exam deferred        Extremities: Normal range of motion.  Edema: Trace  Mental Status:  Normal mood and affect. Normal behavior. Normal judgment and thought content.   Assessment and Plan:  Pregnancy: G3P2002 at [redacted]w[redacted]d  1. Supervision of other normal pregnancy, antepartum Doing well, no concerns. Continue routine prenatal care.  Term labor symptoms and general obstetric precautions including but not limited to vaginal bleeding, contractions, leaking of fluid and fetal movement were reviewed in detail with the patient. Please refer to After Visit Summary for other counseling recommendations.  Return in about 1 week (around  09/21/2017).   Rolm Bookbinder, DO

## 2017-09-14 NOTE — Patient Instructions (Signed)

## 2017-09-21 ENCOUNTER — Ambulatory Visit (INDEPENDENT_AMBULATORY_CARE_PROVIDER_SITE_OTHER): Payer: Medicaid Other | Admitting: Advanced Practice Midwife

## 2017-09-21 VITALS — BP 130/76 | HR 92 | Wt 185.9 lb

## 2017-09-21 DIAGNOSIS — Z3480 Encounter for supervision of other normal pregnancy, unspecified trimester: Secondary | ICD-10-CM

## 2017-09-21 DIAGNOSIS — Z3483 Encounter for supervision of other normal pregnancy, third trimester: Secondary | ICD-10-CM

## 2017-09-21 NOTE — Progress Notes (Signed)
   PRENATAL VISIT NOTE  Subjective:  Joanne Gross is a 31 y.o. G3P2002 at [redacted]w[redacted]d being seen today for ongoing prenatal care.  She is currently monitored for the following issues for this low-risk pregnancy and has Rh negative state in antepartum period, third trimester; Supervision of other normal pregnancy, antepartum; and History of gestational hypertension on her problem list.  Patient reports no complaints.  Contractions: Irregular. Vag. Bleeding: None.  Movement: Present. Denies leaking of fluid.   The following portions of the patient's history were reviewed and updated as appropriate: allergies, current medications, past family history, past medical history, past social history, past surgical history and problem list. Problem list updated.  Objective:   Vitals:   09/21/17 0920  BP: 130/76  Pulse: 92  Weight: 185 lb 14.4 oz (84.3 kg)    Fetal Status: Fetal Heart Rate (bpm): 140   Movement: Present     General:  Alert, oriented and cooperative. Patient is in no acute distress.  Skin: Skin is warm and dry. No rash noted.   Cardiovascular: Normal heart rate noted  Respiratory: Normal respiratory effort, no problems with respiration noted  Abdomen: Soft, gravid, appropriate for gestational age.  Pain/Pressure: Present     Pelvic: Cervical exam deferred        Extremities: Normal range of motion.  Edema: None  Mental Status:  Normal mood and affect. Normal behavior. Normal judgment and thought content.   Assessment and Plan:  Pregnancy: G3P2002 at [redacted]w[redacted]d  1. Supervision of other normal pregnancy, antepartum - Routine care -AFI/NST at next visit - Discussed post dates plan with patient.   Term labor symptoms and general obstetric precautions including but not limited to vaginal bleeding, contractions, leaking of fluid and fetal movement were reviewed in detail with the patient. Please refer to After Visit Summary for other counseling recommendations.  Return in about 1 week  (around 09/28/2017).   Thressa Sheller, CNM

## 2017-09-21 NOTE — Patient Instructions (Signed)
Third Trimester of Pregnancy The third trimester is from week 28 through week 40 (months 7 through 9). The third trimester is a time when the unborn baby (fetus) is growing rapidly. At the end of the ninth month, the fetus is about 20 inches in length and weighs 6-10 pounds. Body changes during your third trimester Your body will continue to go through many changes during pregnancy. The changes vary from woman to woman. During the third trimester:  Your weight will continue to increase. You can expect to gain 25-35 pounds (11-16 kg) by the end of the pregnancy.  You may begin to get stretch marks on your hips, abdomen, and breasts.  You may urinate more often because the fetus is moving lower into your pelvis and pressing on your bladder.  You may develop or continue to have heartburn. This is caused by increased hormones that slow down muscles in the digestive tract.  You may develop or continue to have constipation because increased hormones slow digestion and cause the muscles that push waste through your intestines to relax.  You may develop hemorrhoids. These are swollen veins (varicose veins) in the rectum that can itch or be painful.  You may develop swollen, bulging veins (varicose veins) in your legs.  You may have increased body aches in the pelvis, back, or thighs. This is due to weight gain and increased hormones that are relaxing your joints.  You may have changes in your hair. These can include thickening of your hair, rapid growth, and changes in texture. Some women also have hair loss during or after pregnancy, or hair that feels dry or thin. Your hair will most likely return to normal after your baby is born.  Your breasts will continue to grow and they will continue to become tender. A yellow fluid (colostrum) may leak from your breasts. This is the first milk you are producing for your baby.  Your belly button may stick out.  You may notice more swelling in your hands,  face, or ankles.  You may have increased tingling or numbness in your hands, arms, and legs. The skin on your belly may also feel numb.  You may feel short of breath because of your expanding uterus.  You may have more problems sleeping. This can be caused by the size of your belly, increased need to urinate, and an increase in your body's metabolism.  You may notice the fetus "dropping," or moving lower in your abdomen (lightening).  You may have increased vaginal discharge.  You may notice your joints feel loose and you may have pain around your pelvic bone.  What to expect at prenatal visits You will have prenatal exams every 2 weeks until week 36. Then you will have weekly prenatal exams. During a routine prenatal visit:  You will be weighed to make sure you and the baby are growing normally.  Your blood pressure will be taken.  Your abdomen will be measured to track your baby's growth.  The fetal heartbeat will be listened to.  Any test results from the previous visit will be discussed.  You may have a cervical check near your due date to see if your cervix has softened or thinned (effaced).  You will be tested for Group B streptococcus. This happens between 35 and 37 weeks.  Your health care provider may ask you:  What your birth plan is.  How you are feeling.  If you are feeling the baby move.  If you have had   any abnormal symptoms, such as leaking fluid, bleeding, severe headaches, or abdominal cramping.  If you are using any tobacco products, including cigarettes, chewing tobacco, and electronic cigarettes.  If you have any questions.  Other tests or screenings that may be performed during your third trimester include:  Blood tests that check for low iron levels (anemia).  Fetal testing to check the health, activity level, and growth of the fetus. Testing is done if you have certain medical conditions or if there are problems during the  pregnancy.  Nonstress test (NST). This test checks the health of your baby to make sure there are no signs of problems, such as the baby not getting enough oxygen. During this test, a belt is placed around your belly. The baby is made to move, and its heart rate is monitored during movement.  What is false labor? False labor is a condition in which you feel small, irregular tightenings of the muscles in the womb (contractions) that usually go away with rest, changing position, or drinking water. These are called Braxton Hicks contractions. Contractions may last for hours, days, or even weeks before true labor sets in. If contractions come at regular intervals, become more frequent, increase in intensity, or become painful, you should see your health care provider. What are the signs of labor?  Abdominal cramps.  Regular contractions that start at 10 minutes apart and become stronger and more frequent with time.  Contractions that start on the top of the uterus and spread down to the lower abdomen and back.  Increased pelvic pressure and dull back pain.  A watery or bloody mucus discharge that comes from the vagina.  Leaking of amniotic fluid. This is also known as your "water breaking." It could be a slow trickle or a gush. Let your health care provider know if it has a color or strange odor. If you have any of these signs, call your health care provider right away, even if it is before your due date. Follow these instructions at home: Medicines  Follow your health care provider's instructions regarding medicine use. Specific medicines may be either safe or unsafe to take during pregnancy.  Take a prenatal vitamin that contains at least 600 micrograms (mcg) of folic acid.  If you develop constipation, try taking a stool softener if your health care provider approves. Eating and drinking  Eat a balanced diet that includes fresh fruits and vegetables, whole grains, good sources of protein  such as meat, eggs, or tofu, and low-fat dairy. Your health care provider will help you determine the amount of weight gain that is right for you.  Avoid raw meat and uncooked cheese. These carry germs that can cause birth defects in the baby.  If you have low calcium intake from food, talk to your health care provider about whether you should take a daily calcium supplement.  Eat four or five small meals rather than three large meals a day.  Limit foods that are high in fat and processed sugars, such as fried and sweet foods.  To prevent constipation: ? Drink enough fluid to keep your urine clear or pale yellow. ? Eat foods that are high in fiber, such as fresh fruits and vegetables, whole grains, and beans. Activity  Exercise only as directed by your health care provider. Most women can continue their usual exercise routine during pregnancy. Try to exercise for 30 minutes at least 5 days a week. Stop exercising if you experience uterine contractions.  Avoid heavy   lifting.  Do not exercise in extreme heat or humidity, or at high altitudes.  Wear low-heel, comfortable shoes.  Practice good posture.  You may continue to have sex unless your health care provider tells you otherwise. Relieving pain and discomfort  Take frequent breaks and rest with your legs elevated if you have leg cramps or low back pain.  Take warm sitz baths to soothe any pain or discomfort caused by hemorrhoids. Use hemorrhoid cream if your health care provider approves.  Wear a good support bra to prevent discomfort from breast tenderness.  If you develop varicose veins: ? Wear support pantyhose or compression stockings as told by your healthcare provider. ? Elevate your feet for 15 minutes, 3-4 times a day. Prenatal care  Write down your questions. Take them to your prenatal visits.  Keep all your prenatal visits as told by your health care provider. This is important. Safety  Wear your seat belt at  all times when driving.  Make a list of emergency phone numbers, including numbers for family, friends, the hospital, and police and fire departments. General instructions  Avoid cat litter boxes and soil used by cats. These carry germs that can cause birth defects in the baby. If you have a cat, ask someone to clean the litter box for you.  Do not travel far distances unless it is absolutely necessary and only with the approval of your health care provider.  Do not use hot tubs, steam rooms, or saunas.  Do not drink alcohol.  Do not use any products that contain nicotine or tobacco, such as cigarettes and e-cigarettes. If you need help quitting, ask your health care provider.  Do not use any medicinal herbs or unprescribed drugs. These chemicals affect the formation and growth of the baby.  Do not douche or use tampons or scented sanitary pads.  Do not cross your legs for long periods of time.  To prepare for the arrival of your baby: ? Take prenatal classes to understand, practice, and ask questions about labor and delivery. ? Make a trial run to the hospital. ? Visit the hospital and tour the maternity area. ? Arrange for maternity or paternity leave through employers. ? Arrange for family and friends to take care of pets while you are in the hospital. ? Purchase a rear-facing car seat and make sure you know how to install it in your car. ? Pack your hospital bag. ? Prepare the baby's nursery. Make sure to remove all pillows and stuffed animals from the baby's crib to prevent suffocation.  Visit your dentist if you have not gone during your pregnancy. Use a soft toothbrush to brush your teeth and be gentle when you floss. Contact a health care provider if:  You are unsure if you are in labor or if your water has broken.  You become dizzy.  You have mild pelvic cramps, pelvic pressure, or nagging pain in your abdominal area.  You have lower back pain.  You have persistent  nausea, vomiting, or diarrhea.  You have an unusual or bad smelling vaginal discharge.  You have pain when you urinate. Get help right away if:  Your water breaks before 37 weeks.  You have regular contractions less than 5 minutes apart before 37 weeks.  You have a fever.  You are leaking fluid from your vagina.  You have spotting or bleeding from your vagina.  You have severe abdominal pain or cramping.  You have rapid weight loss or weight gain.    You have shortness of breath with chest pain.  You notice sudden or extreme swelling of your face, hands, ankles, feet, or legs.  Your baby makes fewer than 10 movements in 2 hours.  You have severe headaches that do not go away when you take medicine.  You have vision changes. Summary  The third trimester is from week 28 through week 40, months 7 through 9. The third trimester is a time when the unborn baby (fetus) is growing rapidly.  During the third trimester, your discomfort may increase as you and your baby continue to gain weight. You may have abdominal, leg, and back pain, sleeping problems, and an increased need to urinate.  During the third trimester your breasts will keep growing and they will continue to become tender. A yellow fluid (colostrum) may leak from your breasts. This is the first milk you are producing for your baby.  False labor is a condition in which you feel small, irregular tightenings of the muscles in the womb (contractions) that eventually go away. These are called Braxton Hicks contractions. Contractions may last for hours, days, or even weeks before true labor sets in.  Signs of labor can include: abdominal cramps; regular contractions that start at 10 minutes apart and become stronger and more frequent with time; watery or bloody mucus discharge that comes from the vagina; increased pelvic pressure and dull back pain; and leaking of amniotic fluid. This information is not intended to replace advice  given to you by your health care provider. Make sure you discuss any questions you have with your health care provider. Document Released: 12/02/2001 Document Revised: 05/15/2016 Document Reviewed: 02/08/2013 Elsevier Interactive Patient Education  2017 [employer]  

## 2017-09-27 ENCOUNTER — Inpatient Hospital Stay (HOSPITAL_COMMUNITY): Payer: Medicaid Other | Admitting: Anesthesiology

## 2017-09-27 ENCOUNTER — Inpatient Hospital Stay (HOSPITAL_COMMUNITY)
Admission: AD | Admit: 2017-09-27 | Discharge: 2017-09-30 | DRG: 807 | Disposition: A | Discharge status: Home / Self Care | Payer: Medicaid Other | Source: Ambulatory Visit | Attending: Obstetrics & Gynecology | Admitting: Obstetrics & Gynecology

## 2017-09-27 ENCOUNTER — Inpatient Hospital Stay (HOSPITAL_COMMUNITY)
Admission: AD | Admit: 2017-09-27 | Discharge: 2017-09-27 | Disposition: A | Discharge status: Home / Self Care | Payer: Medicaid Other | Source: Ambulatory Visit | Attending: Obstetrics & Gynecology | Admitting: Obstetrics & Gynecology

## 2017-09-27 ENCOUNTER — Encounter (HOSPITAL_COMMUNITY): Payer: Self-pay | Admitting: *Deleted

## 2017-09-27 ENCOUNTER — Encounter (HOSPITAL_COMMUNITY): Payer: Self-pay

## 2017-09-27 DIAGNOSIS — O26893 Other specified pregnancy related conditions, third trimester: Principal | ICD-10-CM | POA: Diagnosis present

## 2017-09-27 DIAGNOSIS — O471 False labor at or after 37 completed weeks of gestation: Secondary | ICD-10-CM

## 2017-09-27 DIAGNOSIS — Z3480 Encounter for supervision of other normal pregnancy, unspecified trimester: Secondary | ICD-10-CM

## 2017-09-27 DIAGNOSIS — Z3A40 40 weeks gestation of pregnancy: Secondary | ICD-10-CM | POA: Diagnosis not present

## 2017-09-27 DIAGNOSIS — O134 Gestational [pregnancy-induced] hypertension without significant proteinuria, complicating childbirth: Secondary | ICD-10-CM | POA: Diagnosis not present

## 2017-09-27 DIAGNOSIS — Z6791 Unspecified blood type, Rh negative: Secondary | ICD-10-CM | POA: Diagnosis not present

## 2017-09-27 LAB — COMPREHENSIVE METABOLIC PANEL
ALK PHOS: 311 U/L — AB (ref 38–126)
ALT: 14 U/L (ref 14–54)
AST: 26 U/L (ref 15–41)
Albumin: 3.2 g/dL — ABNORMAL LOW (ref 3.5–5.0)
Anion gap: 11 (ref 5–15)
BILIRUBIN TOTAL: 0.4 mg/dL (ref 0.3–1.2)
BUN: 8 mg/dL (ref 6–20)
CO2: 20 mmol/L — AB (ref 22–32)
CREATININE: 0.63 mg/dL (ref 0.44–1.00)
Calcium: 9 mg/dL (ref 8.9–10.3)
Chloride: 103 mmol/L (ref 101–111)
Glucose, Bld: 90 mg/dL (ref 65–99)
Potassium: 4.2 mmol/L (ref 3.5–5.1)
Sodium: 134 mmol/L — ABNORMAL LOW (ref 135–145)
TOTAL PROTEIN: 7.1 g/dL (ref 6.5–8.1)

## 2017-09-27 LAB — CBC
HCT: 36.8 % (ref 36.0–46.0)
Hemoglobin: 11.9 g/dL — ABNORMAL LOW (ref 12.0–15.0)
MCH: 25.7 pg — AB (ref 26.0–34.0)
MCHC: 32.3 g/dL (ref 30.0–36.0)
MCV: 79.5 fL (ref 78.0–100.0)
PLATELETS: 292 10*3/uL (ref 150–400)
RBC: 4.63 MIL/uL (ref 3.87–5.11)
RDW: 15.4 % (ref 11.5–15.5)
WBC: 10.7 10*3/uL — ABNORMAL HIGH (ref 4.0–10.5)

## 2017-09-27 LAB — TYPE AND SCREEN
ABO/RH(D): O NEG
Antibody Screen: NEGATIVE

## 2017-09-27 MED ORDER — LACTATED RINGERS IV SOLN: 500.0000 mL | INTRAVENOUS | Status: DC | PRN

## 2017-09-27 MED ORDER — DIPHENHYDRAMINE HCL 50 MG/ML IJ SOLN: 12.5000 mg | INTRAMUSCULAR | Status: DC | PRN

## 2017-09-27 MED ORDER — PHENYLEPHRINE 40 MCG/ML (10ML) SYRINGE FOR IV PUSH (FOR BLOOD PRESSURE SUPPORT)
80.0000 ug | PREFILLED_SYRINGE | INTRAVENOUS | Status: DC | PRN
Start: 2017-09-27 — End: 2017-09-28
  Filled 2017-09-27: qty 5
  Filled 2017-09-27: qty 10

## 2017-09-27 MED ORDER — OXYTOCIN BOLUS FROM INFUSION
500.0000 mL | Freq: Once | INTRAVENOUS | Status: AC
  Administered 2017-09-28: 500 mL via INTRAVENOUS

## 2017-09-27 MED ORDER — FENTANYL 2.5 MCG/ML BUPIVACAINE 1/10 % EPIDURAL INFUSION (WH - ANES)
14.0000 mL/h | INTRAMUSCULAR | Status: DC | PRN
  Administered 2017-09-27: 14 mL/h via EPIDURAL
  Filled 2017-09-27: qty 100

## 2017-09-27 MED ORDER — LACTATED RINGERS IV SOLN
500.0000 mL | Freq: Once | INTRAVENOUS | Status: AC
  Administered 2017-09-27: 500 mL via INTRAVENOUS

## 2017-09-27 MED ORDER — PHENYLEPHRINE 40 MCG/ML (10ML) SYRINGE FOR IV PUSH (FOR BLOOD PRESSURE SUPPORT)
80.0000 ug | PREFILLED_SYRINGE | INTRAVENOUS | Status: DC | PRN
  Filled 2017-09-27: qty 5

## 2017-09-27 MED ORDER — SOD CITRATE-CITRIC ACID 500-334 MG/5ML PO SOLN: 30.0000 mL | ORAL | Status: DC | PRN

## 2017-09-27 MED ORDER — LACTATED RINGERS IV BOLUS (SEPSIS): 1000.0000 mL | Freq: Once | INTRAVENOUS | Status: DC

## 2017-09-27 MED ORDER — LIDOCAINE HCL (PF) 1 % IJ SOLN
30.0000 mL | INTRAMUSCULAR | Status: DC | PRN
  Filled 2017-09-27: qty 30

## 2017-09-27 MED ORDER — OXYCODONE-ACETAMINOPHEN 5-325 MG PO TABS: 1.0000 | ORAL_TABLET | ORAL | Status: DC | PRN

## 2017-09-27 MED ORDER — EPHEDRINE 5 MG/ML INJ
10.0000 mg | INTRAVENOUS | Status: DC | PRN
  Filled 2017-09-27: qty 2

## 2017-09-27 MED ORDER — LACTATED RINGERS IV SOLN
INTRAVENOUS | Status: DC
  Administered 2017-09-28: 02:00:00 via INTRAVENOUS

## 2017-09-27 MED ORDER — LIDOCAINE HCL (PF) 1 % IJ SOLN
INTRAMUSCULAR | Status: DC | PRN
  Administered 2017-09-27 (×2): 5 mL via EPIDURAL

## 2017-09-27 MED ORDER — EPHEDRINE 5 MG/ML INJ
10.0000 mg | INTRAVENOUS | Status: DC | PRN
Start: 2017-09-27 — End: 2017-09-28
  Filled 2017-09-27: qty 2

## 2017-09-27 MED ORDER — LACTATED RINGERS IV SOLN: 500.0000 mL | Freq: Once | INTRAVENOUS | Status: DC

## 2017-09-27 MED ORDER — ONDANSETRON HCL 4 MG/2ML IJ SOLN: 4.0000 mg | Freq: Four times a day (QID) | INTRAMUSCULAR | Status: DC | PRN

## 2017-09-27 MED ORDER — FLEET ENEMA 7-19 GM/118ML RE ENEM
1.0000 | ENEMA | RECTAL | Status: DC | PRN
Start: 2017-09-27 — End: 2017-09-28

## 2017-09-27 MED ORDER — OXYCODONE-ACETAMINOPHEN 5-325 MG PO TABS: 2.0000 | ORAL_TABLET | ORAL | Status: DC | PRN

## 2017-09-27 MED ORDER — FENTANYL CITRATE (PF) 100 MCG/2ML IJ SOLN: 100.0000 ug | INTRAMUSCULAR | Status: DC | PRN

## 2017-09-27 MED ORDER — ACETAMINOPHEN 325 MG PO TABS: 650.0000 mg | ORAL_TABLET | ORAL | Status: DC | PRN

## 2017-09-27 MED ORDER — OXYTOCIN 40 UNITS IN LACTATED RINGERS INFUSION - SIMPLE MED
2.5000 [IU]/h | INTRAVENOUS | Status: DC
  Filled 2017-09-27: qty 1000

## 2017-09-27 NOTE — Progress Notes (Addendum)
G3P2 with SVD with both. Presents to triage for labor eval. States been ctx for past 3 hrs q5-6 mins. Denies LOF or bleeding. +FM EFM applied  SVE closed.   Hx: last pregnancy induced for GHTN @ [redacted] wksga. Current pregn being monitored for HTN. See flow sheet for details of serial bp  1317: pt sitting upright so picking up maternal hr. 1320: transducer adjusted in instructed pt that we needed her to lay back to get fetal tracing.   1330: Provider notified. Report status of pt given. Orders received to give pt option to walk for 2 hrs and recheck cervix or go home on labor eval.   Pt given the option and preferrs to go home. MD notified. Made aware of pt's choice and also her fluctuating high bp.   1337: Discharge instructions received. CNM notified and updated on pt status and MD recommendations to either walk 2 hrs or home on labor eval.   1348: Discharge labor precaution instructions given with pt understanding. Pt left unit via ambulatory with friend.

## 2017-09-27 NOTE — Anesthesia Procedure Notes (Addendum)
Epidural Patient location during procedure: OB Start time: 09/27/2017 10:48 PM End time: 09/27/2017 10:58 PM  Staffing Anesthesiologist: Zofia Peckinpaugh  Preanesthetic Checklist Completed: patient identified, site marked, surgical consent, pre-op evaluation, timeout performed, IV checked, risks and benefits discussed and monitors and equipment checked  Epidural Patient position: sitting Prep: site prepped and draped and DuraPrep Patient monitoring: continuous pulse ox and blood pressure Approach: midline Location: L4-L5 Injection technique: LOR air  Needle:  Needle type: Tuohy  Needle gauge: 17 G Needle length: 9 cm and 9 Needle insertion depth: 6 cm Catheter type: closed end flexible Catheter size: 19 Gauge Catheter at skin depth: 10 cm Test dose: negative  Assessment Events: blood not aspirated, injection not painful, no injection resistance, negative IV test and no paresthesia

## 2017-09-27 NOTE — Anesthesia Preprocedure Evaluation (Signed)
Anesthesia Evaluation  Patient identified by MRN, date of birth, ID band Patient awake    Reviewed: Allergy & Precautions, H&P , NPO status , Patient's Chart, lab work & pertinent test results, reviewed documented beta blocker date and time   Airway Mallampati: II  TM Distance: >3 FB Neck ROM: full    Dental no notable dental hx.    Pulmonary neg pulmonary ROS,    Pulmonary exam normal breath sounds clear to auscultation       Cardiovascular hypertension, negative cardio ROS Normal cardiovascular exam Rhythm:regular Rate:Normal     Neuro/Psych negative neurological ROS  negative psych ROS   GI/Hepatic negative GI ROS, Neg liver ROS,   Endo/Other  negative endocrine ROS  Renal/GU negative Renal ROS  negative genitourinary   Musculoskeletal   Abdominal   Peds  Hematology negative hematology ROS (+)   Anesthesia Other Findings   Reproductive/Obstetrics (+) Pregnancy                             Anesthesia Physical Anesthesia Plan  ASA: II  Anesthesia Plan: Epidural   Post-op Pain Management:    Induction:   PONV Risk Score and Plan:   Airway Management Planned:   Additional Equipment:   Intra-op Plan:   Post-operative Plan:   Informed Consent: I have reviewed the patients History and Physical, chart, labs and discussed the procedure including the risks, benefits and alternatives for the proposed anesthesia with the patient or authorized representative who has indicated his/her understanding and acceptance.       Plan Discussed with:   Anesthesia Plan Comments:         Anesthesia Quick Evaluation  

## 2017-09-27 NOTE — H&P (Signed)
LABOR AND DELIVERY ADMISSION HISTORY AND PHYSICAL NOTE  Joanne Gross is a 31 y.o. female G3P2002 with IUP at [redacted]w[redacted]d by LMP + 23-wk U/S presenting for SOL. Seen around noon today in MAU for regular contractions, and cervix was closed. Returns now with increase in frqunecy and strength of contractions.  She reports positive fetal movement. She denies leakage of fluid or vaginal bleeding.She also denies any headache, visual disturbances, RUQ/epigastric pain, or SOB.  Prenatal History/Complications: PNC at [facility] Pregnancy complications:  - Late prenatal care (21 weeks) - Hx of gestational HTN - Rh negative  Past Medical History: Past Medical History:  Diagnosis Date  . No pertinent past medical history   . Pregnancy induced hypertension     Past Surgical History: Past Surgical History:  Procedure Laterality Date  . NO PAST SURGERIES    . VAGINAL DELIVERY  04/2012    Obstetrical History: OB History    Gravida Para Term Preterm AB Living   0 0 2   SAB TAB Ectopic Multiple Live Births   0 0 0 0 2      Social History: Social History   Social History  . Marital status: Legally Separated    Spouse name: N/A  . Number of children: N/A  . Years of education: N/A   Social History Main Topics  . Smoking status: Never Smoker  . Smokeless tobacco: Never Used  . Alcohol use No  . Drug use: No  . Sexual activity: Not Currently    Birth control/ protection: None   Other Topics Concern  . None   Social History Narrative  . None    Family History: Family History  Problem Relation Age of Onset  . Hypertension Mother   . Diabetes Maternal Aunt   . Diabetes Maternal Grandmother     Allergies: No Known Allergies  Prescriptions Prior to Admission  Medication Sig Dispense Refill Last Dose  . Prenatal Vit-Fe Fumarate-FA (PRENATAL MULTIVITAMIN) TABS Take 1 tablet by mouth daily.   Taking     Review of Systems  All systems reviewed and negative except as stated in  HPI  Physical Exam Blood pressure 128/79, pulse 88, temperature 97.7 F (36.5 C), temperature source Oral, resp. rate 18, height  (1.6 m), weight 189 lb (85.7 kg), last menstrual period 12/18/2016, SpO2 99 %, currently breastfeeding. General appearance: alert and cooperative, breathing through contractions Lungs: normal WOB, no respiratory distress Heart: regular rate and rhythm Abdomen: soft, non-tender; gravid, appropriate for GA Extremities: No calf swelling or tenderness Presentation: cephalic by SVE Fetal monitoring: baseline rate 130, moderate variability, +acel, no decel Uterine activity: ctx q1-3 min Dilation: 5.5 Effacement (%): 80 Station: -2, -3 Exam by:: [email or web address]-[email or web address], rnc   Prenatal labs: ABO, Rh: O/Negative/-- (05/24 1429) Antibody: Negative (05/24 1429) Rubella: 9.74 (05/24 1429) RPR: Non Reactive (07/12 1018)  HBsAg: Negative (05/24 1429)  HIV:   NR (07/02/17) GC/Chlamydia: negative on 08/26/17 GBS:   negative 2 hr GTT: 81, 148, 124 - normal Genetic screening:  Not done Anatomy US: normal visualized anatomy, but limited views of abdominal wall and ductal arch  Prenatal Transfer Tool  Maternal Diabetes: No Genetic Screening: not done Maternal Ultrasounds/Referrals: Normal Fetal Ultrasounds or other Referrals:  None Maternal Substance Abuse:  No Significant Maternal Medications:  None Significant Maternal Lab Results: None  No results found for this or any previous visit (from the past 24 hour(s)).  Patient Active Problem List   Diagnosis Date Noted  .  History of gestational hypertension 05/22/2017  . Supervision of other normal pregnancy, antepartum 05/14/2017  . Rh negative state in antepartum period, third trimester 02/20/2016    Assessment: Joanne Gross is a 31 y.o. G3P2002 at [redacted]w[redacted]d here for SOL. She is Rh negative. Hx of gestational HTN. Had a BP of 143/85 today, asymptomatic. Will check PIH labs.  #Labor: expectanat  management #Pain: Patient would like epidural #FWB: Cat I #ID:  GBS neg #MOF: breast #MOC: undecided #Circ:  outpatient  Joanne Gross 09/27/2017, 10:23 PM

## 2017-09-27 NOTE — MAU Note (Signed)
Pt here with c/o contractions that are more intense; Denies any bleeding or leaking of fluid. Was closed earlier today. GBS is negative

## 2017-09-27 NOTE — MAU Note (Signed)
Started contracting 2-3hrs ago.  No bleeding or leaking, no recent exam.  2 prior vag deliveries.  Uncomplicated pregnancies

## 2017-09-28 ENCOUNTER — Encounter (HOSPITAL_COMMUNITY): Payer: Self-pay | Admitting: *Deleted

## 2017-09-28 ENCOUNTER — Other Ambulatory Visit: Payer: Medicaid Other

## 2017-09-28 DIAGNOSIS — Z3A40 40 weeks gestation of pregnancy: Secondary | ICD-10-CM

## 2017-09-28 DIAGNOSIS — O134 Gestational [pregnancy-induced] hypertension without significant proteinuria, complicating childbirth: Secondary | ICD-10-CM

## 2017-09-28 LAB — PROTEIN / CREATININE RATIO, URINE
CREATININE, URINE: 137 mg/dL
Protein Creatinine Ratio: 0.1 mg/mg{Cre} (ref 0.00–0.15)
TOTAL PROTEIN, URINE: 14 mg/dL

## 2017-09-28 MED ORDER — DIPHENHYDRAMINE HCL 25 MG PO CAPS: 25.0000 mg | ORAL_CAPSULE | Freq: Four times a day (QID) | ORAL | Status: DC | PRN

## 2017-09-28 MED ORDER — BENZOCAINE-MENTHOL 20-0.5 % EX AERO
1.0000 "application " | INHALATION_SPRAY | CUTANEOUS | Status: DC | PRN
  Administered 2017-09-28: 1 via TOPICAL
  Filled 2017-09-28: qty 56

## 2017-09-28 MED ORDER — ACETAMINOPHEN 325 MG PO TABS
650.0000 mg | ORAL_TABLET | ORAL | Status: DC | PRN
  Administered 2017-09-28 – 2017-09-30 (×4): 650 mg via ORAL
  Filled 2017-09-28 (×4): qty 2

## 2017-09-28 MED ORDER — ONDANSETRON HCL 4 MG/2ML IJ SOLN: 4.0000 mg | INTRAMUSCULAR | Status: DC | PRN

## 2017-09-28 MED ORDER — ONDANSETRON HCL 4 MG PO TABS: 4.0000 mg | ORAL_TABLET | ORAL | Status: DC | PRN

## 2017-09-28 MED ORDER — IBUPROFEN 600 MG PO TABS
600.0000 mg | ORAL_TABLET | Freq: Four times a day (QID) | ORAL | Status: DC
  Administered 2017-09-28 – 2017-09-30 (×10): 600 mg via ORAL
  Filled 2017-09-28 (×10): qty 1

## 2017-09-28 MED ORDER — WITCH HAZEL-GLYCERIN EX PADS: 1.0000 "application " | MEDICATED_PAD | CUTANEOUS | Status: DC | PRN

## 2017-09-28 MED ORDER — ZOLPIDEM TARTRATE 5 MG PO TABS: 5.0000 mg | ORAL_TABLET | Freq: Every evening | ORAL | Status: DC | PRN

## 2017-09-28 MED ORDER — PRENATAL MULTIVITAMIN CH
1.0000 | ORAL_TABLET | Freq: Every day | ORAL | Status: DC
  Administered 2017-09-28 – 2017-09-30 (×3): 1 via ORAL
  Filled 2017-09-28 (×3): qty 1

## 2017-09-28 MED ORDER — DIBUCAINE 1 % RE OINT: 1.0000 "application " | TOPICAL_OINTMENT | RECTAL | Status: DC | PRN

## 2017-09-28 MED ORDER — SIMETHICONE 80 MG PO CHEW
80.0000 mg | CHEWABLE_TABLET | ORAL | Status: DC | PRN
Start: 2017-09-28 — End: 2017-09-30

## 2017-09-28 MED ORDER — TETANUS-DIPHTH-ACELL PERTUSSIS 5-2.5-18.5 LF-MCG/0.5 IM SUSP: 0.5000 mL | Freq: Once | INTRAMUSCULAR | Status: DC

## 2017-09-28 MED ORDER — SENNOSIDES-DOCUSATE SODIUM 8.6-50 MG PO TABS
2.0000 | ORAL_TABLET | ORAL | Status: DC
  Administered 2017-09-28 – 2017-09-30 (×2): 2 via ORAL
  Filled 2017-09-28 (×2): qty 2

## 2017-09-28 MED ORDER — COCONUT OIL OIL: 1.0000 "application " | TOPICAL_OIL | Status: DC | PRN

## 2017-09-28 NOTE — Lactation Note (Signed)
This note was copied from a baby's chart. Lactation Consultation Note  Patient Name: Joanne Gross [id number] Date: 09/28/2017 Reason for consult: Initial assessment  Baby 15 hours old. Mom reports that baby is latching and nursing very well, and she is feeling her milk letdown and hearing swallows while baby latching. Mom given [facility] brochure, aware of OP/BFSG and LC phone line assistance after D/C.   Maternal Data    Feeding Feeding Type: Breast Fed Length of feed: 30 min  LATCH Score Latch: Grasps breast easily, tongue down, lips flanged, rhythmical sucking.  Audible Swallowing: Spontaneous and intermittent  Type of Nipple: Everted at rest and after stimulation  Comfort (Breast/Nipple): Soft / non-tender  Hold (Positioning): No assistance needed to correctly position infant at breast.  LATCH Score: 10  Interventions    Lactation Tools Discussed/Used     Consult Status Consult Status: PRN    Sherlyn Hay 09/28/2017, 5:32 PM

## 2017-09-28 NOTE — Anesthesia Postprocedure Evaluation (Signed)
Anesthesia Post Note  Patient: Joanne Gross  Procedure(s) Performed: AN AD HOC LABOR EPIDURAL     Patient location during evaluation: Mother Baby Anesthesia Type: Epidural Level of consciousness: awake and alert Pain management: pain level controlled Vital Signs Assessment: post-procedure vital signs reviewed and stable Respiratory status: spontaneous breathing, nonlabored ventilation and respiratory function stable Cardiovascular status: stable Postop Assessment: no headache, no backache and epidural receding Anesthetic complications: no    Last Vitals:  Vitals:   09/28/17 0301 09/28/17 0316  BP: 124/80 128/75  Pulse: 92 92  Resp: 18 18  Temp:    SpO2:      Last Pain:  Vitals:   09/28/17 0316  TempSrc:   PainSc: 0-No pain   Pain Goal: Patients Stated Pain Goal: 5 (09/27/17 2242)               Natacia Chaisson

## 2017-09-28 NOTE — Anesthesia Postprocedure Evaluation (Signed)
Anesthesia Post Note  Patient: Joanne Gross  Procedure(s) Performed: AN AD HOC LABOR EPIDURAL     Patient location during evaluation: Mother Baby Anesthesia Type: Epidural Level of consciousness: awake and alert and oriented Pain management: pain level controlled Vital Signs Assessment: post-procedure vital signs reviewed and stable Respiratory status: spontaneous breathing and nonlabored ventilation Cardiovascular status: stable Postop Assessment: no headache, patient able to bend at knees, no backache, no apparent nausea or vomiting, epidural receding and adequate PO intake Anesthetic complications: no    Last Vitals:  Vitals:   09/28/17 0450 09/28/17 0547  BP: 133/76 126/68  Pulse: 97 (!) 103  Resp: 16 16  Temp: 36.9 C 36.9 C  SpO2:      Last Pain:  Vitals:   09/28/17 0547  TempSrc: Oral  PainSc:    Pain Goal: Patients Stated Pain Goal: 5 (09/27/17 2242)               Laban Emperor

## 2017-09-28 NOTE — Addendum Note (Signed)
Addendum  created 09/28/17 0981 by Elgie Congo, CRNA   Charge Capture section accepted, Sign clinical note

## 2017-09-29 LAB — RPR: RPR: NONREACTIVE

## 2017-09-29 NOTE — Progress Notes (Signed)
Post Partum Day 1 Subjective: no complaints, up ad lib, tolerating PO, + flatus and per patient, she is not getting up and walking much. Encouraged to walk. Some mild cramping that is controlled with pain meds   Objective: Blood pressure 116/75, pulse 86, temperature 98.5 F (36.9 C), temperature source Oral, resp. rate 16, height  (1.6 m), weight 189 lb (85.7 kg), last menstrual period 12/18/2016, SpO2 100 %, unknown if currently breastfeeding.  Physical Exam:  General: alert, cooperative and no distress Lochia: appropriate Uterine Fundus: firm Incision: n/a DVT Evaluation: No evidence of DVT seen on physical exam. No cords or calf tenderness.   Recent Labs  09/27/17 2215  HGB 11.9*  HCT 36.8    Assessment/Plan: Plan for discharge tomorrow, Breastfeeding and Contraception none at this time   LOS: 2 days   Arlyce Harman 09/29/2017, 8:58 AM   OB FELLOW POSTPARTUM PROGRESS NOTE ATTESTATION  I have seen and examined this patient and agree with above documentation in the resident's note.   Frederik Pear, MD OB Fellow 9:20 AM

## 2017-09-30 NOTE — Lactation Note (Signed)
This note was copied from a baby's chart. Lactation Consultation Note  Patient Name: Joanne Gross [id number] Date: 09/30/2017 Reason for consult: Follow-up assessment;Infant weight loss (7% weight loss )  Baby is 55 hours old , 7% weight loss, @ 46 hours Bili check 4.1. Per mom breast are fuller today, no sore nipples.  Per mom baby tends to favor the left breast over the right , I think it is due to  The nipples. LC assessed breast tissue with moms permission and noted Some edema at the base of the nipple . LC instructed mom on the reverse pressure  Exercise and the the shells , hand pump, and increased flange in case she needed when  Her milk comes in . Sore nipple and engorgement prevention and tx reviewed.  Per mom she has a 35 month old baby at home that is still breast feeding.  LC stressed the importance of making sure the baby's needs at met 1st.  Discussed nutritive vs non - nutritive feeding cues. And to watch for hanging out at the breast.  Mother informed of post-discharge support and given phone number to the lactation department, including services for phone call assistance; out-patient appointments; and breastfeeding support group. List of other breastfeeding resources in the community given in the handout. Encouraged mother to call for problems or concerns related to breastfeeding.    Maternal Data Has patient been taught Hand Expression?: Yes (per mom experienced )  Feeding Feeding Type: Breast Fed Length of feed:  (mulltiple swallows )  LATCH Score Latch: Grasps breast easily, tongue down, lips flanged, rhythmical sucking.  Audible Swallowing: Spontaneous and intermittent  Type of Nipple: Everted at rest and after stimulation  Comfort (Breast/Nipple): Filling, red/small blisters or bruises, mild/mod discomfort  Hold (Positioning): No assistance needed to correctly position infant at breast.  LATCH Score: 9  Interventions Interventions: Breast feeding  basics reviewed;Assisted with latch;Skin to skin;Hand express;Breast massage;Breast compression;Shells;Hand pump  Lactation Tools Discussed/Used Tools: Shells;Pump;Flanges Flange Size: 27 Shell Type: Inverted Breast pump type: Manual WIC Program: Yes Pump Review: Setup, frequency, and cleaning;Milk Storage Initiated by:: MAI  Date initiated:: 09/30/17   Consult Status Consult Status: Complete    Jerlyn Ly Tirzah Fross 09/30/2017, 10:18 AM

## 2017-09-30 NOTE — Progress Notes (Signed)
Post Partum Day 2 Subjective: no complaints, up ad lib, voiding and tolerating PO  Objective: Blood pressure 124/77, pulse 76, temperature 98.3 F (36.8 C), temperature source Oral, resp. rate 18, height  (1.6 m), weight 189 lb (85.7 kg), last menstrual period 12/18/2016, SpO2 100 %, unknown if currently breastfeeding.  Physical Exam:  General: alert, cooperative and no distress Lochia: appropriate Uterine Fundus: firm Incision: n/a DVT Evaluation: No evidence of DVT seen on physical exam. No cords or calf tenderness.   Recent Labs  09/27/17 2215  HGB 11.9*  HCT 36.8    Assessment/Plan: Discharge home, Breastfeeding and Contraception none   LOS: 3 days   Arlyce Harman 09/30/2017, 7:30 AM

## 2017-09-30 NOTE — Discharge Summary (Signed)
OB Discharge Summary     Patient Name: Joanne Gross DOB: [birth year] MRN: [id number]  Date of admission: 09/27/2017 Delivering MD: Frederik Pear   Date of discharge: 09/30/2017  Admitting diagnosis: 40 WKS, CTXS Intrauterine pregnancy: [redacted]w[redacted]d     Secondary diagnosis:  Principal Problem:   SVD (spontaneous vaginal delivery) Active Problems:   Rh negative state in antepartum period, third trimester  Additional problems: none     Discharge diagnosis: Term Pregnancy Delivered                                                                                                Post partum procedures:none  Augmentation: Pitocin  Complications: None  Hospital course:  Induction of Labor With Vaginal Delivery   31 y.o. yo [id number] at [redacted]w[redacted]d was admitted to the hospital 09/27/2017 for induction of labor.  Indication for induction: Gestational hypertension.  Patient had an uncomplicated labor course as follows: Membrane Rupture Time/Date: 1:20 AM ,09/28/2017   Intrapartum Procedures: Episiotomy: None [1]                                         Lacerations:  None [1]  Patient had delivery of a Viable infant.  Information for the patient's newborn:  Annalei, Friesz [[id number]]  Delivery Method: Vag-Spont   09/28/2017  Details of delivery can be found in separate delivery note.  Patient had a routine postpartum course. Patient is discharged home 09/30/17.  Physical exam  Vitals:   09/28/17 1917 09/29/17 0511 09/29/17 1900 09/30/17 0500  BP: 138/82 116/75 (!) 114/56 124/77  Pulse: 95 86 89 76  Resp: Temp: 98.4 F (36.9 C) 98.5 F (36.9 C) 97.9 F (36.6 C) 98.3 F (36.8 C)  TempSrc: Oral Oral Oral Oral  SpO2:      Weight:      Height:       General: alert, cooperative and no distress Lochia: appropriate Uterine Fundus: firm Incision: N/A DVT Evaluation: No evidence of DVT seen on physical exam. No cords or calf tenderness. Labs: Lab Results  Component Value  Date   WBC 10.7 (H) 09/27/2017   HGB 11.9 (L) 09/27/2017   HCT 36.8 09/27/2017   MCV 79.5 09/27/2017   PLT 292 09/27/2017   CMP Latest Ref Rng & Units 09/27/2017  Glucose 65 - 99 mg/dL 90  BUN 6 - 20 mg/dL 8  Creatinine 7.82 - 9.56 mg/dL 2.13  Sodium 086 - 578 mmol/L 134(L)  Potassium 3.5 - 5.1 mmol/L 4.2  Chloride 101 - 111 mmol/L 103  CO2 22 - 32 mmol/L 20(L)  Calcium 8.9 - 10.3 mg/dL 9.0  Total Protein 6.5 - 8.1 g/dL 7.1  Total Bilirubin 0.3 - 1.2 mg/dL 0.4  Alkaline Phos 38 - 126 U/L 311(H)  AST 15 - 41 U/L 26  ALT 14 - 54 U/L 14    Discharge instruction: per After Visit Summary and "Baby and Me Booklet".  After visit meds:  Allergies  as of 09/30/2017   No Known Allergies     Medication List    TAKE these medications   prenatal multivitamin Tabs tablet Take 1 tablet by mouth daily.       Diet: routine diet  Activity: Advance as tolerated. Pelvic rest for 6 weeks.   Outpatient follow up:6 weeks Follow up Appt:No future appointments. Follow up Visit:No Follow-up on file.  Postpartum contraception: None  Newborn Data: Live born female  Birth Weight: 8 lb 7.6 oz (3844 g) APGAR: 9, 9  Newborn Delivery   Birth date/time:  09/28/2017 02:19:00 Delivery type:  Vaginal, Spontaneous Delivery      Baby Feeding: Breast Disposition:home with mother   09/30/2017 Arlyce Harman, DO

## 2017-11-09 ENCOUNTER — Ambulatory Visit (INDEPENDENT_AMBULATORY_CARE_PROVIDER_SITE_OTHER): Payer: Medicaid Other | Admitting: Advanced Practice Midwife

## 2017-11-09 NOTE — Patient Instructions (Signed)
LactMed  http://www.rush.com/https://toxnet.nlm.nih.gov/newtoxnet/lactmed.htm

## 2017-11-09 NOTE — Progress Notes (Signed)
Subjective:     Joanne SentersChristina M Peek is a 31 y.o. female who presents for a postpartum visit. She is 6 weeks postpartum following a spontaneous vaginal delivery. I have fully reviewed the prenatal and intrapartum course. The delivery was at 40 gestational weeks. Outcome: spontaneous vaginal delivery. Anesthesia: epidural. Postpartum course has been unremarkable. Baby's course has been unremarkable. Baby is feeding by breast. Tandem nursing with older son as well. Bleeding no bleeding. Bowel function is normal. Bladder function is normal. Patient is not sexually active. Contraception method is none. Postpartum depression screening: negative.  The following portions of the patient's history were reviewed and updated as appropriate: allergies, current medications, past family history, past medical history, past social history, past surgical history and problem list.  Review of Systems Pertinent items are noted in HPI.   Objective:    There were no vitals taken for this visit.  General:  alert and cooperative   Breasts:  inspection negative, no nipple discharge or bleeding, no masses or nodularity palpable  Lungs: clear to auscultation bilaterally  Heart:  regular rate and rhythm  Abdomen: soft, non-tender; bowel sounds normal; no masses,  no organomegaly   Vulva:  not evaluated  Vagina: not evaluated  Cervix:  not evaluated   Corpus: not examined  Adnexa:  not evaluated  Rectal Exam: Not performed.        Assessment:     Normal postpartum exam. Pap smear not done at today's visit.   Plan:    1. Contraception: none 2. Routine care, pap due 02/2019 3. Follow up in: 1 year or as needed.

## 2019-04-16 IMAGING — US US MFM OB COMP +14 WKS
1 series · 14 of 28 positions shown · non-contrast
Comparison: none

[Series 1: us mfm ob comp +14 wks · 59 acquisitions, 14 frames shown]
[im 3/59]
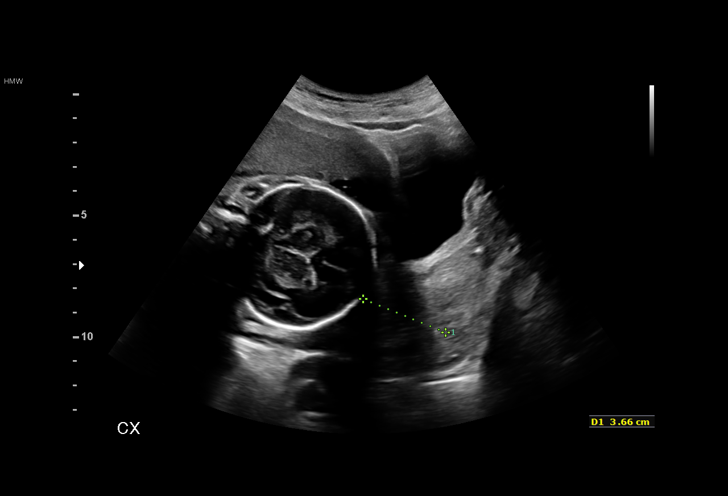
[im 7/59]
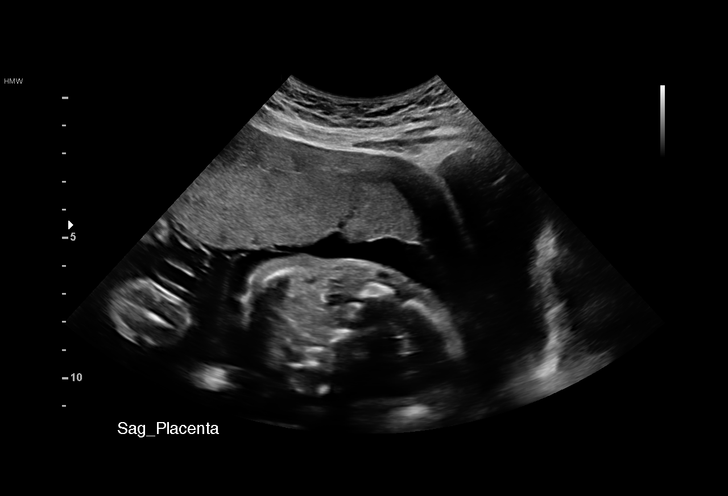
[im 11/59]
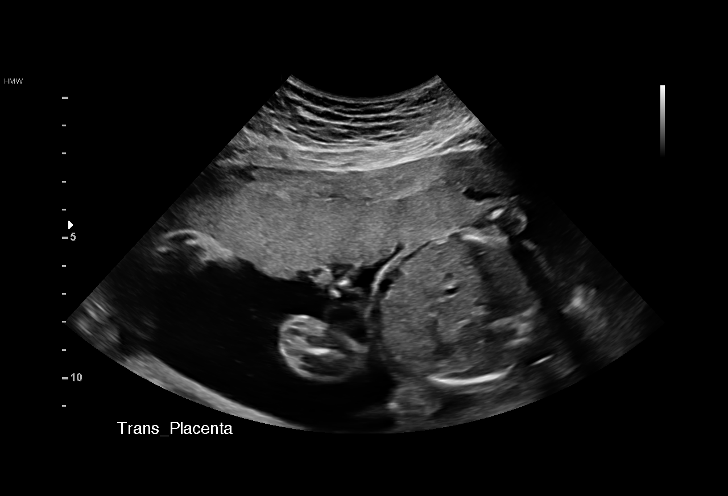
[im 16/59]
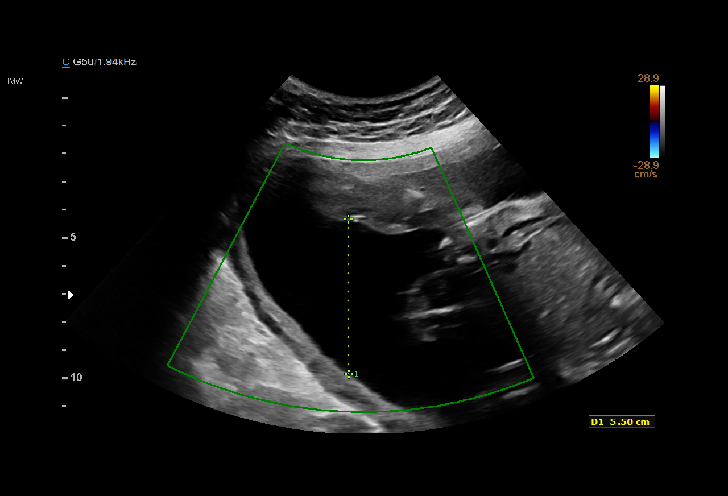
[im 20/59]
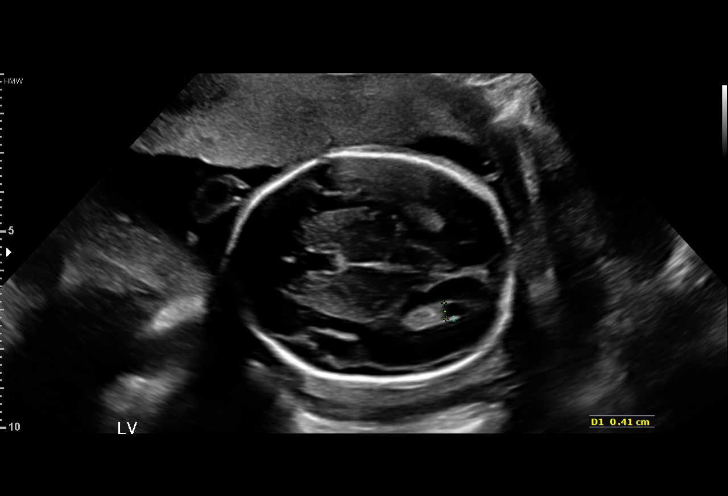
[im 24/59]
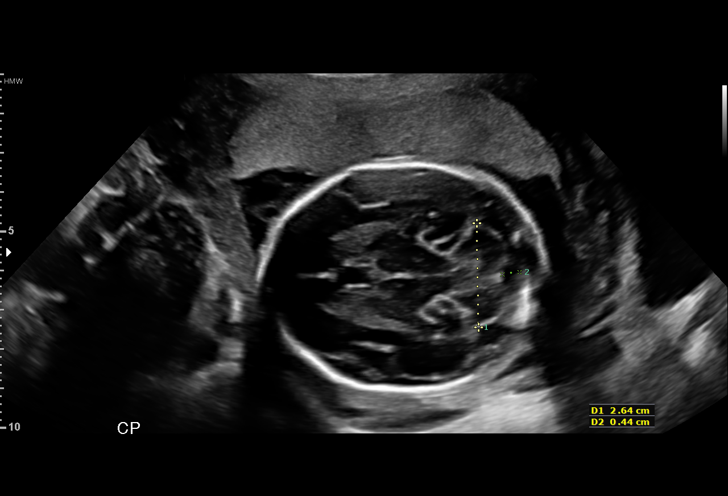
[im 28/59]
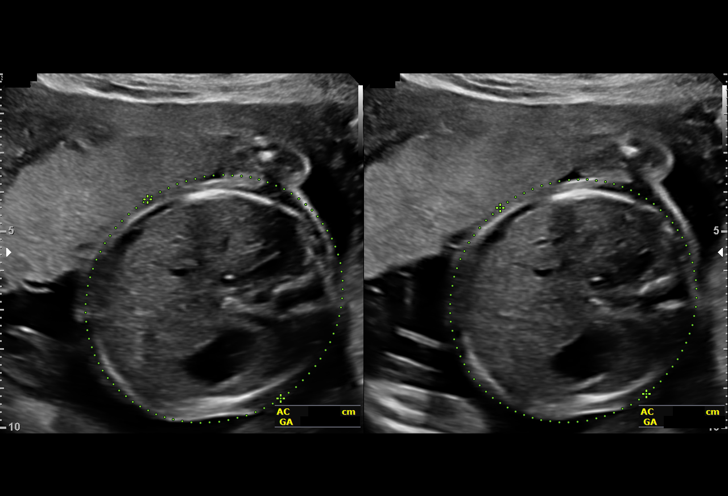
[im 33/59]
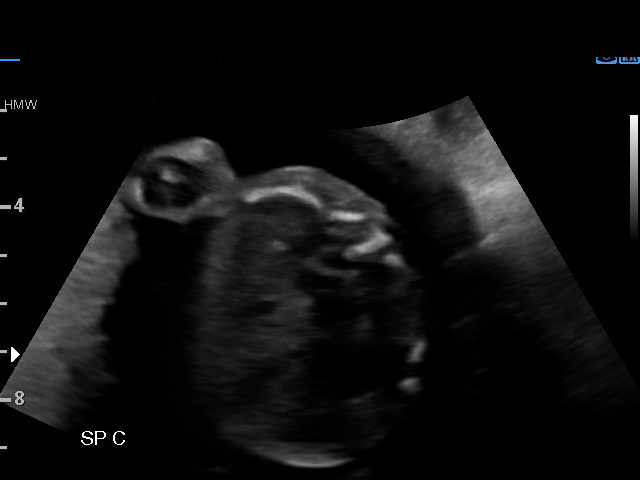
[im 37/59]
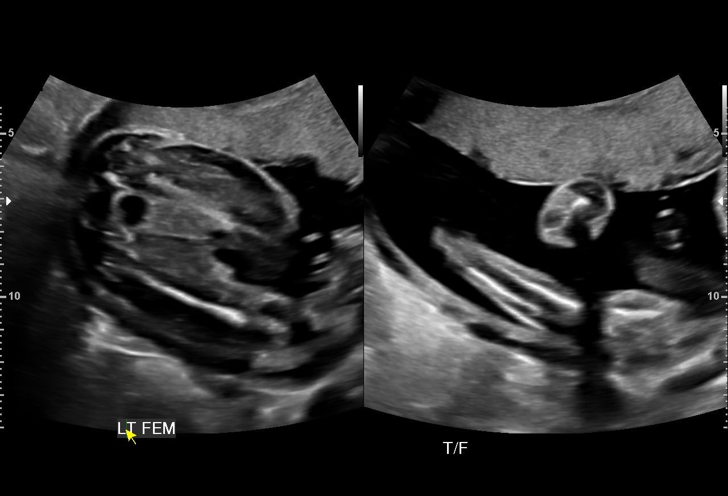
[im 41/59]
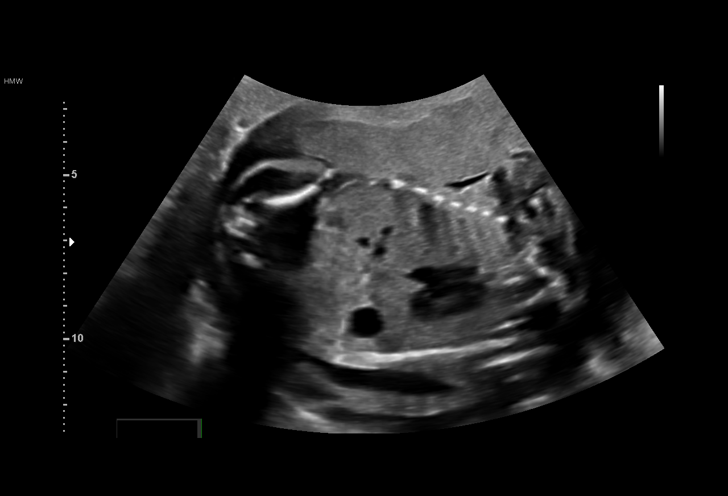
[im 46/59]
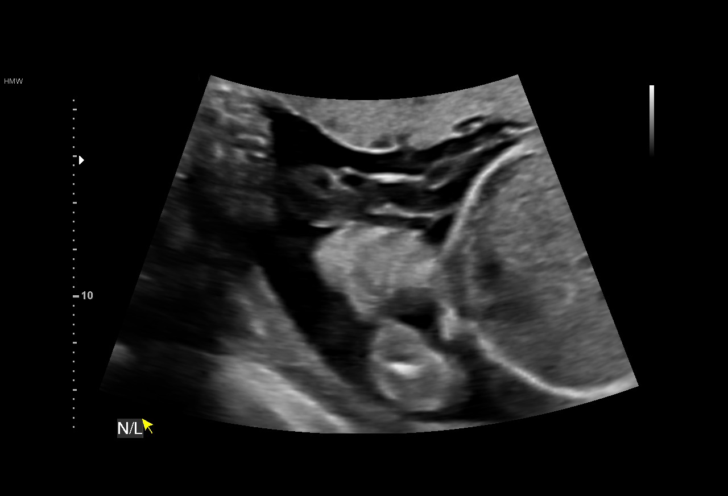
[im 50/59]
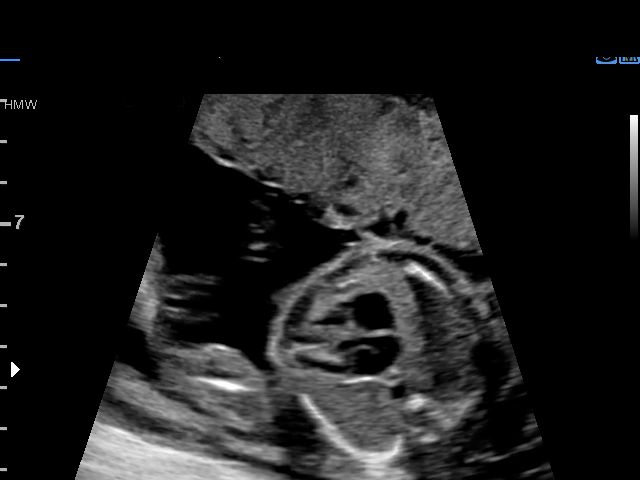
[im 54/59]
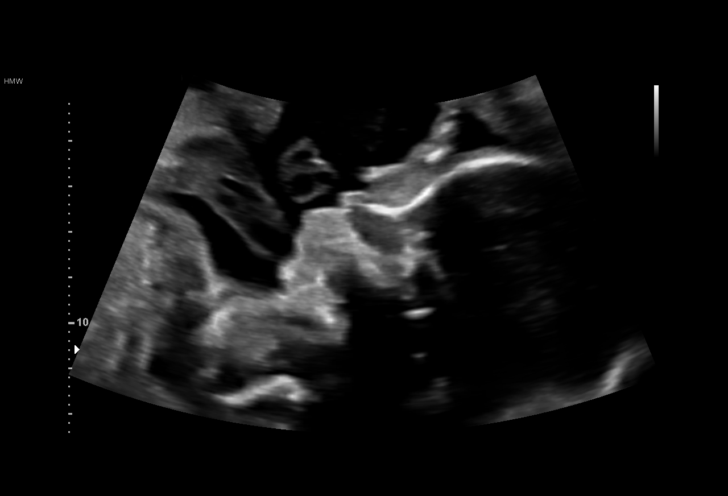
[im 59/59]
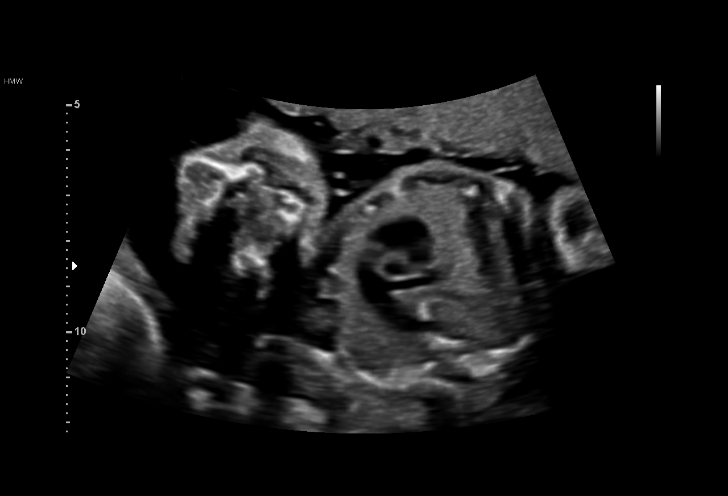

[14 of 28 positions shown; findings below may reference images not displayed]

pm)

1  IBTHEL GAA           [id number]      [id number]     [id number]
Indications

23 weeks gestation of pregnancy
Encounter for antenatal screening for
malformations
Late prenatal care, second trimester

OB History

Gravidity:    3         Term:   2        Prem:   0        SAB:   0
TOP:          0       Ectopic:  0        Living: 2
Fetal Evaluation

Num Of Fetuses:     1
Fetal Heart         155
Rate(bpm):
Cardiac Activity:   Observed
Presentation:       Cephalic
Placenta:           Anterior, above cervical os
P. Cord Insertion:  Visualized, central

Amniotic Fluid
AFI FV:      Subjectively within normal limits

Largest Pocket(cm)
5.5
Biometry

BPD:      57.6  mm     G. Age:  23w 4d         47  %    CI:         76.4   %   70 - 86
FL/HC:      20.0   %   18.7 -
HC:      208.8  mm     G. Age:  23w 0d         15  %    HC/AC:      1.05       1.05 -
AC:      198.2  mm     G. Age:  24w 4d         70  %    FL/BPD:     72.6   %   71 - 87
FL:       41.8  mm     G. Age:  23w 4d         39  %    FL/AC:      21.1   %   20 - 24
HUM:      39.4  mm     G. Age:  24w 0d         53  %

Est. FW:     647  gm      1 lb 7 oz     59  %
Gestational Age

LMP:           23w 4d       Date:   12/18/16                 EDD:   09/24/17
U/S Today:     23w 5d                                        EDD:   09/23/17
Best:          23w 4d    Det. By:   LMP  (12/18/16)          EDD:   09/24/17
Anatomy

Cranium:               Appears normal         Aortic Arch:            Not well visualized
Cavum:                 Appears normal         Ductal Arch:            Not well visualized
Ventricles:            Appears normal         Diaphragm:              Appears normal
Choroid Plexus:        Appears normal         Stomach:                Appears normal, left
sided
Cerebellum:            Appears normal         Abdomen:                Appears normal
Posterior Fossa:       Appears normal         Abdominal Wall:         Not well visualized
Nuchal Fold:           Not applicable (>20    Cord Vessels:           Appears normal (3
wks GA)                                        vessel cord)
Face:                  Profile nl; orbits not Kidneys:                Appear normal
well visualized
Lips:                  Appears normal         Bladder:                Appears normal
Thoracic:              Appears normal         Spine:                  Appears normal
Heart:                 Appears normal         Upper Extremities:      Appears normal
(4CH, axis, and situs
RVOT:                  Appears normal         Lower Extremities:      Appears normal
LVOT:                  Appears normal
Cervix Uterus Adnexa

Cervix
Length:            3.1  cm.
Normal appearance by transabdominal scan.

Uterus
No abnormality visualized.

Left Ovary
No adnexal mass visualized.

Right Ovary
No adnexal mass visualized.

Cul De Sac:   No free fluid seen.

Adnexa:       No abnormality visualized.
Impression

SIUP at 23+4 weeks
Normal detailed fetal anatomy; limited views of orbits, arches
and CI
Normal amniotic fluid volume
Measurements consistent with LMP dating; EFW at the 59th
%tile
Recommendations

Follow-up ultrasound in 4-6 weeks to complete anatomy
survey or follow-up as clinically indicated

## 2019-05-25 IMAGING — US US MFM OB FOLLOW-UP
1 series · 14 of 28 positions shown · non-contrast
Comparison: none

[Series 1: us mfm ob follow-up · 31 acquisitions, 14 frames shown]
[im 2/31]
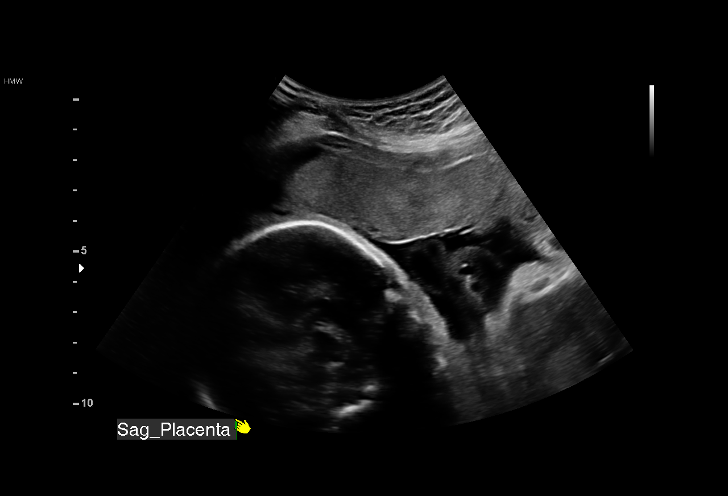
[im 4/31]
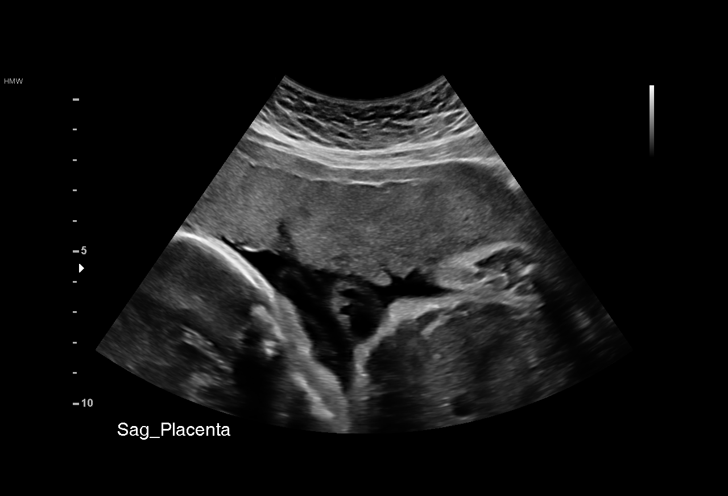
[im 6/31]
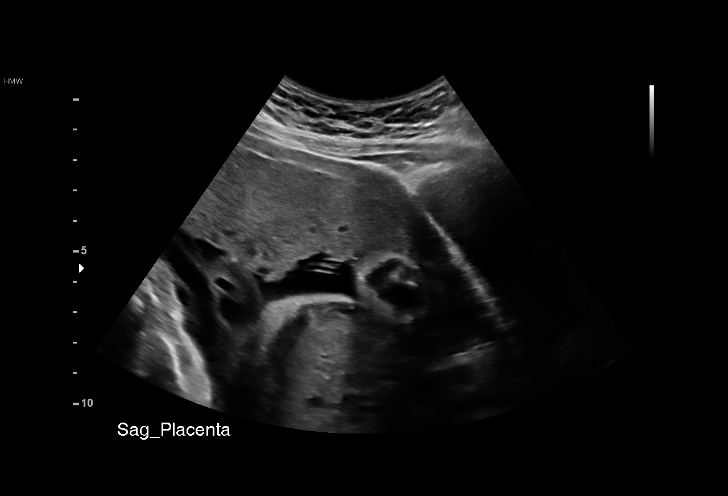
[im 8/31]
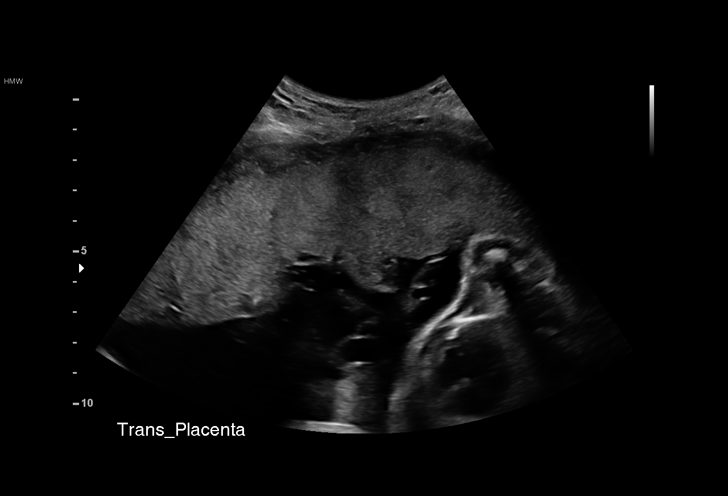
[im 11/31]
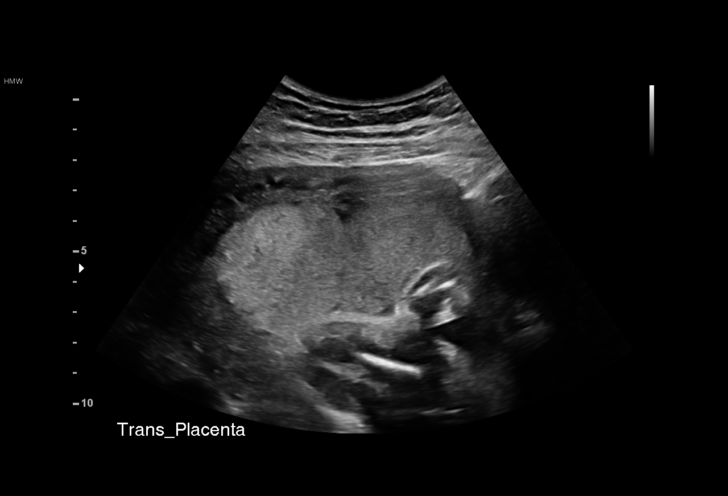
[im 13/31]
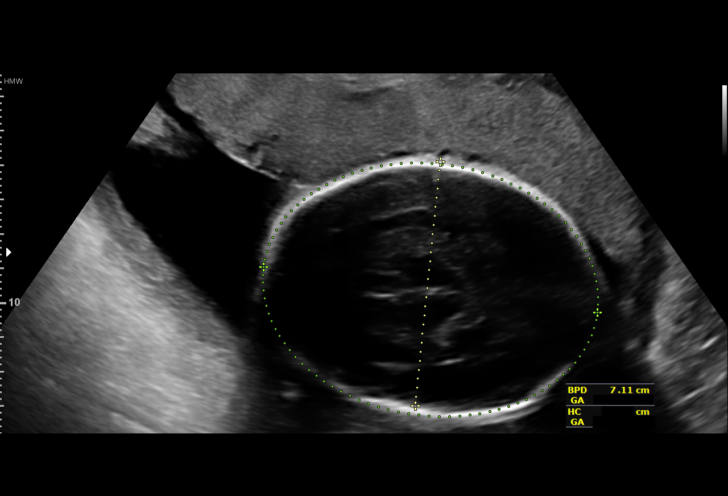
[im 15/31]
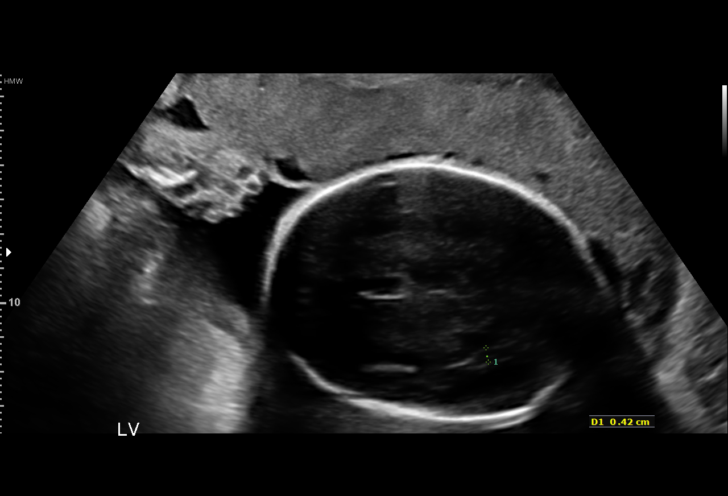
[im 17/31]
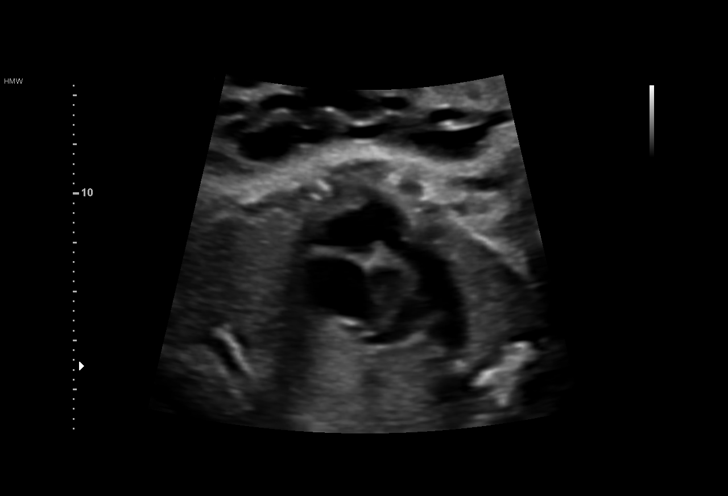
[im 19/31]
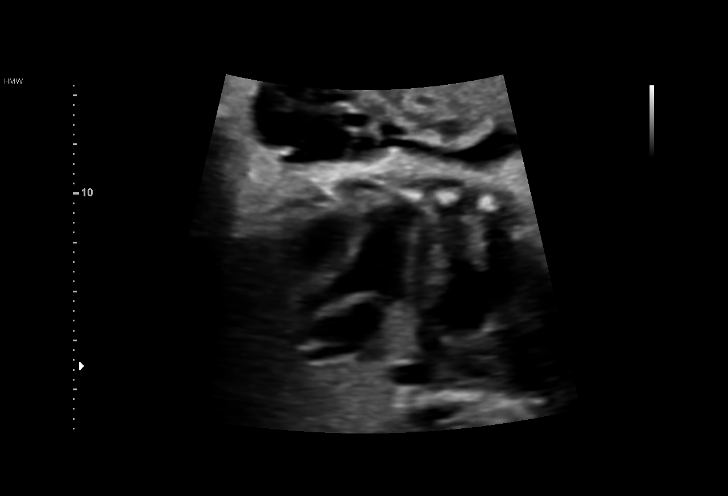
[im 22/31]
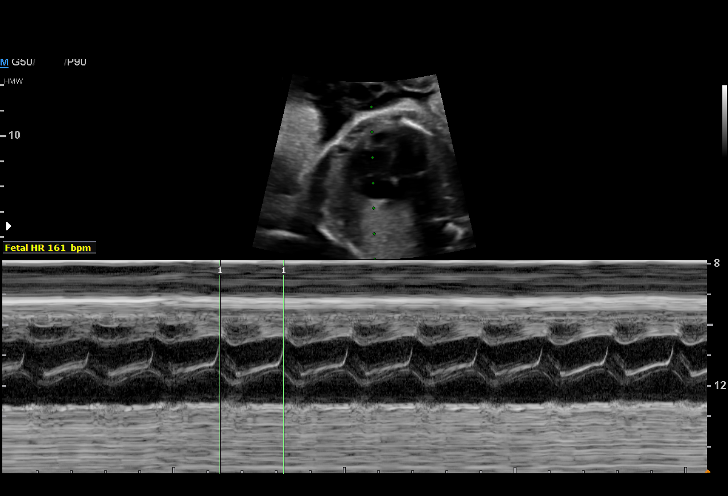
[im 24/31]
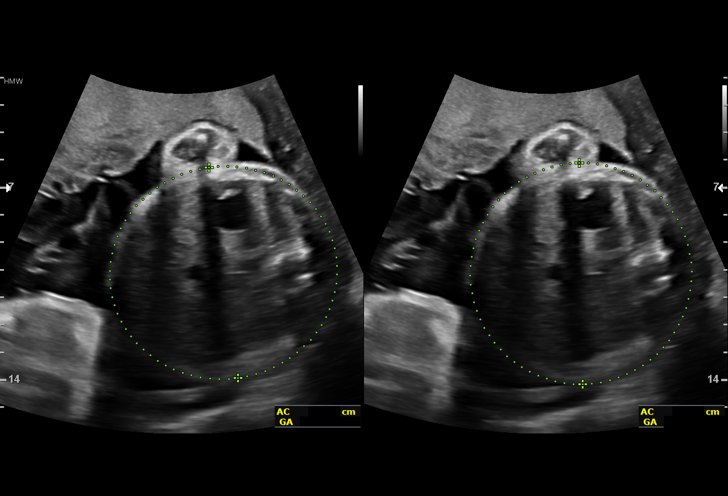
[im 26/31]
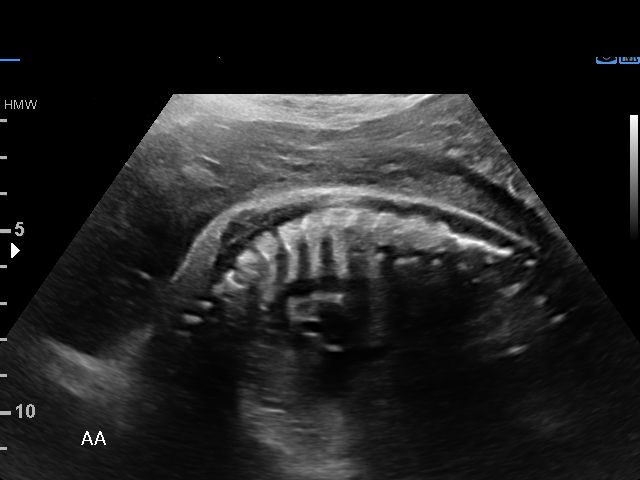
[im 28/31]
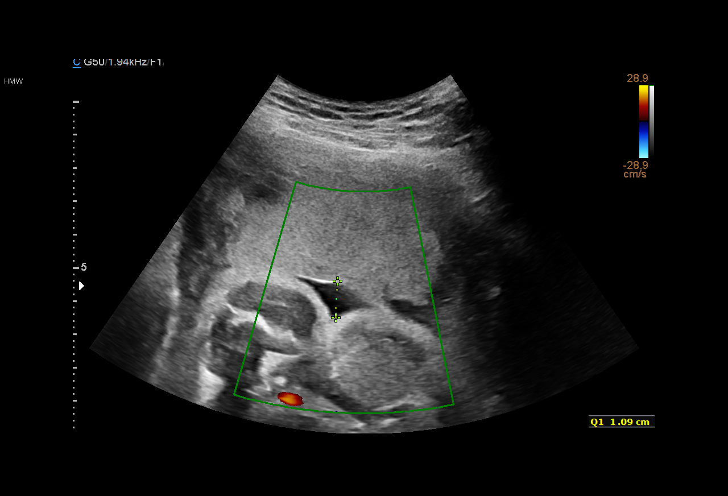
[im 31/31]
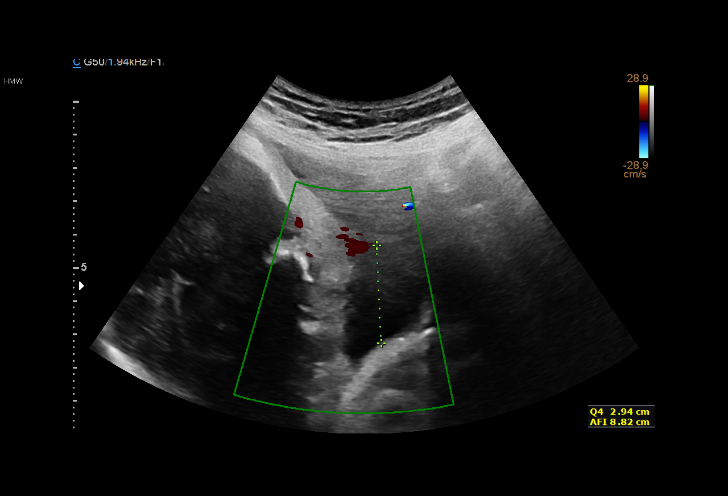

[14 of 28 positions shown; findings below may reference images not displayed]

1  LAYSLA KONISHI          [id number]      [id number]     [id number]
Indications

29 weeks gestation of pregnancy
Late prenatal care, second trimester
Encounter for other antenatal screening
follow-up
OB History

Gravidity:    3         Term:   2        Prem:   0        SAB:   0
TOP:          0       Ectopic:  0        Living: 2
Fetal Evaluation

Num Of Fetuses:     1
Fetal Heart         161
Rate(bpm):
Cardiac Activity:   Observed
Presentation:       Transverse, head to maternal left
Placenta:           Anterior, above cervical os
P. Cord Insertion:  Visualized, central

Amniotic Fluid
AFI FV:      Subjectively within normal limits

AFI Sum(cm)     %Tile       Largest Pocket(cm)
8.81            4

RUQ(cm)       RLQ(cm)       LUQ(cm)        LLQ(cm)
1.09          2.94          0
Biometry
BPD:      71.8  mm     G. Age:  28w 6d         28  %    CI:         70.6   %    70 - 86
FL/HC:      19.6   %    19.6 -
HC:      272.4  mm     G. Age:  29w 5d         34  %    HC/AC:      1.08        0.99 -
AC:      252.2  mm     G. Age:  29w 3d         52  %    FL/BPD:     74.2   %    71 - 87
FL:       53.3  mm     G. Age:  28w 2d         16  %    FL/AC:      21.1   %    20 - 24
HUM:      49.9  mm     G. Age:  29w 2d         47  %

Est. FW:    3144  gm    2 lb 15 oz      49  %
Gestational Age

LMP:           29w 1d        Date:  12/18/16                 EDD:   09/24/17
U/S Today:     29w 1d                                        EDD:   09/24/17
Best:          29w 1d     Det. By:  LMP  (12/18/16)          EDD:   09/24/17
Anatomy

Cranium:               Appears normal         Aortic Arch:            Appears normal
Cavum:                 Previously seen        Ductal Arch:            Not well visualized
Ventricles:            Appears normal         Diaphragm:              Previously seen
Choroid Plexus:        Previously seen        Stomach:                Appears normal, left
sided
Cerebellum:            Previously seen        Abdomen:                Previously seen
Posterior Fossa:       Previously seen        Abdominal Wall:         Not well visualized
Nuchal Fold:           Not applicable (>20    Cord Vessels:           Previously seen
wks GA)
Face:                  Profile nl; orbits not Kidneys:                Appear normal
well visualized
Lips:                  Previously seen        Bladder:                Appears normal
Thoracic:              Appears normal         Spine:                  Previously seen
Heart:                 Appears normal         Upper Extremities:      Previously seen
(4CH, axis, and situs
RVOT:                  Appears normal         Lower Extremities:      Previously seen
LVOT:                  Appears normal

Other:  Technically difficult due to fetal position.
Cervix Uterus Adnexa

Cervix
Not visualized (advanced GA >26wks)

Uterus
No abnormality visualized.

Left Ovary
No adnexal mass visualized.

Right Ovary
No adnexal mass visualized.

Cul De Sac:   No free fluid seen.

Adnexa:       No abnormality visualized.
Impression

Single IUP at 29w 1d
Limited views of the ductal arch and abdominal wall obtained
due to late gestational age and fetal position
Otherwise normal interval anatomy
Fetal growth is appropriate (49th %tile)
Anterior placenta without previa
Normal amniotic fluid volume
Recommendations

Follow-up ultrasounds as clinically indicated.

## 2019-12-23 NOTE — L&D Delivery Note (Signed)
Patient was C/C/+4 and pushed for 1 minute with epidural.    NSVD  Female, infant, Apgars 9,9, weight P.   The patient had no lacerations. Fundus was firm. EBL was expected amount. Placenta was delivered intact. Vagina was clear.  Delayed cord clamping done for 30-60 seconds while warming baby. Baby was vigorous and doing skin to skin with mother.  Joanne Gross

## 2020-04-23 LAB — OB RESULTS CONSOLE RUBELLA ANTIBODY, IGM: Rubella: IMMUNE

## 2020-04-23 LAB — OB RESULTS CONSOLE ANTIBODY SCREEN: Antibody Screen: NEGATIVE

## 2020-04-23 LAB — OB RESULTS CONSOLE HIV ANTIBODY (ROUTINE TESTING): HIV: NONREACTIVE

## 2020-04-23 LAB — OB RESULTS CONSOLE HEPATITIS B SURFACE ANTIGEN: Hepatitis B Surface Ag: NEGATIVE

## 2020-04-23 LAB — OB RESULTS CONSOLE RPR: RPR: NONREACTIVE

## 2020-04-23 LAB — OB RESULTS CONSOLE ABO/RH: RH Type: NEGATIVE

## 2020-04-23 LAB — OB RESULTS CONSOLE GC/CHLAMYDIA
Chlamydia: NEGATIVE
Gonorrhea: NEGATIVE

## 2020-09-19 LAB — OB RESULTS CONSOLE GBS: GBS: NEGATIVE

## 2020-10-05 ENCOUNTER — Encounter (HOSPITAL_COMMUNITY): Payer: Self-pay | Admitting: *Deleted

## 2020-10-05 ENCOUNTER — Telehealth (HOSPITAL_COMMUNITY): Payer: Self-pay | Admitting: *Deleted

## 2020-10-05 NOTE — Telephone Encounter (Signed)
Preadmission screen  

## 2020-10-08 ENCOUNTER — Other Ambulatory Visit (HOSPITAL_COMMUNITY): Payer: Medicaid Other

## 2020-10-09 ENCOUNTER — Inpatient Hospital Stay (HOSPITAL_COMMUNITY): Payer: Medicaid Other | Admitting: Anesthesiology

## 2020-10-09 ENCOUNTER — Other Ambulatory Visit: Payer: Self-pay

## 2020-10-09 ENCOUNTER — Encounter (HOSPITAL_COMMUNITY): Payer: Self-pay | Admitting: Obstetrics & Gynecology

## 2020-10-09 ENCOUNTER — Inpatient Hospital Stay (HOSPITAL_COMMUNITY): Payer: Medicaid Other

## 2020-10-09 ENCOUNTER — Inpatient Hospital Stay (HOSPITAL_COMMUNITY)
Admission: AD | Admit: 2020-10-09 | Discharge: 2020-10-11 | DRG: 807 | Disposition: A | Discharge status: Home / Self Care | Payer: Medicaid Other | Attending: Obstetrics & Gynecology | Admitting: Obstetrics & Gynecology

## 2020-10-09 DIAGNOSIS — Z3A39 39 weeks gestation of pregnancy: Secondary | ICD-10-CM

## 2020-10-09 DIAGNOSIS — Z6791 Unspecified blood type, Rh negative: Secondary | ICD-10-CM | POA: Diagnosis not present

## 2020-10-09 DIAGNOSIS — Z20822 Contact with and (suspected) exposure to covid-19: Secondary | ICD-10-CM | POA: Diagnosis present

## 2020-10-09 DIAGNOSIS — Z23 Encounter for immunization: Secondary | ICD-10-CM | POA: Diagnosis not present

## 2020-10-09 DIAGNOSIS — O26893 Other specified pregnancy related conditions, third trimester: Secondary | ICD-10-CM | POA: Diagnosis present

## 2020-10-09 DIAGNOSIS — R030 Elevated blood-pressure reading, without diagnosis of hypertension: Secondary | ICD-10-CM | POA: Diagnosis present

## 2020-10-09 DIAGNOSIS — Z3490 Encounter for supervision of normal pregnancy, unspecified, unspecified trimester: Secondary | ICD-10-CM

## 2020-10-09 LAB — RESPIRATORY PANEL BY RT PCR (FLU A&B, COVID)
Influenza A by PCR: NEGATIVE
Influenza B by PCR: NEGATIVE
SARS Coronavirus 2 by RT PCR: NEGATIVE

## 2020-10-09 LAB — CBC
HCT: 34.7 % — ABNORMAL LOW (ref 36.0–46.0)
Hemoglobin: 10.6 g/dL — ABNORMAL LOW (ref 12.0–15.0)
MCH: 24.9 pg — ABNORMAL LOW (ref 26.0–34.0)
MCHC: 30.5 g/dL (ref 30.0–36.0)
MCV: 81.6 fL (ref 80.0–100.0)
Platelets: 316 10*3/uL (ref 150–400)
RBC: 4.25 MIL/uL (ref 3.87–5.11)
RDW: 15 % (ref 11.5–15.5)
WBC: 9 10*3/uL (ref 4.0–10.5)
nRBC: 0 % (ref 0.0–0.2)

## 2020-10-09 LAB — RPR: RPR Ser Ql: NONREACTIVE

## 2020-10-09 LAB — TYPE AND SCREEN
ABO/RH(D): O NEG
Antibody Screen: NEGATIVE

## 2020-10-09 MED ORDER — EPHEDRINE 5 MG/ML INJ: 10.0000 mg | INTRAVENOUS | Status: DC | PRN

## 2020-10-09 MED ORDER — ONDANSETRON HCL 4 MG/2ML IJ SOLN: 4.0000 mg | Freq: Four times a day (QID) | INTRAMUSCULAR | Status: DC | PRN

## 2020-10-09 MED ORDER — METHYLERGONOVINE MALEATE 0.2 MG PO TABS: 0.2000 mg | ORAL_TABLET | ORAL | Status: DC | PRN

## 2020-10-09 MED ORDER — COCONUT OIL OIL: 1.0000 "application " | TOPICAL_OIL | Status: DC | PRN

## 2020-10-09 MED ORDER — ONDANSETRON HCL 4 MG/2ML IJ SOLN: 4.0000 mg | INTRAMUSCULAR | Status: DC | PRN

## 2020-10-09 MED ORDER — ZOLPIDEM TARTRATE 5 MG PO TABS: 5.0000 mg | ORAL_TABLET | Freq: Every evening | ORAL | Status: DC | PRN

## 2020-10-09 MED ORDER — SIMETHICONE 80 MG PO CHEW: 80.0000 mg | CHEWABLE_TABLET | ORAL | Status: DC | PRN

## 2020-10-09 MED ORDER — LACTATED RINGERS IV SOLN: INTRAVENOUS | Status: DC

## 2020-10-09 MED ORDER — LIDOCAINE HCL (PF) 1 % IJ SOLN
INTRAMUSCULAR | Status: DC | PRN
  Administered 2020-10-09: 11 mL via EPIDURAL

## 2020-10-09 MED ORDER — FERROUS SULFATE 325 (65 FE) MG PO TABS
325.0000 mg | ORAL_TABLET | Freq: Two times a day (BID) | ORAL | Status: DC
  Administered 2020-10-10 – 2020-10-11 (×3): 325 mg via ORAL
  Filled 2020-10-09 (×3): qty 1

## 2020-10-09 MED ORDER — METHYLERGONOVINE MALEATE 0.2 MG/ML IJ SOLN: 0.2000 mg | INTRAMUSCULAR | Status: DC | PRN

## 2020-10-09 MED ORDER — PHENYLEPHRINE 40 MCG/ML (10ML) SYRINGE FOR IV PUSH (FOR BLOOD PRESSURE SUPPORT): 80.0000 ug | PREFILLED_SYRINGE | INTRAVENOUS | Status: DC | PRN

## 2020-10-09 MED ORDER — SOD CITRATE-CITRIC ACID 500-334 MG/5ML PO SOLN: 30.0000 mL | ORAL | Status: DC | PRN

## 2020-10-09 MED ORDER — SODIUM CHLORIDE 0.9% FLUSH: 3.0000 mL | INTRAVENOUS | Status: DC | PRN

## 2020-10-09 MED ORDER — OXYTOCIN-SODIUM CHLORIDE 30-0.9 UT/500ML-% IV SOLN: 2.5000 [IU]/h | INTRAVENOUS | Status: DC

## 2020-10-09 MED ORDER — OXYCODONE-ACETAMINOPHEN 5-325 MG PO TABS: 2.0000 | ORAL_TABLET | ORAL | Status: DC | PRN

## 2020-10-09 MED ORDER — MISOPROSTOL 25 MCG QUARTER TABLET
25.0000 ug | ORAL_TABLET | ORAL | Status: DC | PRN
  Administered 2020-10-09 (×2): 25 ug via VAGINAL
  Filled 2020-10-09 (×2): qty 1

## 2020-10-09 MED ORDER — LIDOCAINE HCL (PF) 1 % IJ SOLN: 30.0000 mL | INTRAMUSCULAR | Status: DC | PRN

## 2020-10-09 MED ORDER — TERBUTALINE SULFATE 1 MG/ML IJ SOLN: 0.2500 mg | Freq: Once | INTRAMUSCULAR | Status: DC | PRN

## 2020-10-09 MED ORDER — SODIUM CHLORIDE 0.9% FLUSH
3.0000 mL | Freq: Two times a day (BID) | INTRAVENOUS | Status: DC
  Administered 2020-10-09: 3 mL via INTRAVENOUS

## 2020-10-09 MED ORDER — ACETAMINOPHEN 325 MG PO TABS
650.0000 mg | ORAL_TABLET | ORAL | Status: DC | PRN
  Administered 2020-10-10 (×3): 650 mg via ORAL
  Filled 2020-10-09 (×3): qty 2

## 2020-10-09 MED ORDER — MEASLES, MUMPS & RUBELLA VAC IJ SOLR: 0.5000 mL | Freq: Once | INTRAMUSCULAR | Status: DC

## 2020-10-09 MED ORDER — FENTANYL-BUPIVACAINE-NACL 0.5-0.125-0.9 MG/250ML-% EP SOLN: 12.0000 mL/h | EPIDURAL | Status: DC | PRN

## 2020-10-09 MED ORDER — OXYTOCIN-SODIUM CHLORIDE 30-0.9 UT/500ML-% IV SOLN
1.0000 m[IU]/min | INTRAVENOUS | Status: DC
  Administered 2020-10-09: 2 m[IU]/min via INTRAVENOUS
  Filled 2020-10-09: qty 500

## 2020-10-09 MED ORDER — ONDANSETRON HCL 4 MG PO TABS: 4.0000 mg | ORAL_TABLET | ORAL | Status: DC | PRN

## 2020-10-09 MED ORDER — MAGNESIUM HYDROXIDE 400 MG/5ML PO SUSP: 30.0000 mL | ORAL | Status: DC | PRN

## 2020-10-09 MED ORDER — PRENATAL MULTIVITAMIN CH
1.0000 | ORAL_TABLET | Freq: Every day | ORAL | Status: DC
  Administered 2020-10-10: 1 via ORAL
  Filled 2020-10-09: qty 1

## 2020-10-09 MED ORDER — WITCH HAZEL-GLYCERIN EX PADS: 1.0000 "application " | MEDICATED_PAD | CUTANEOUS | Status: DC | PRN

## 2020-10-09 MED ORDER — DIBUCAINE (PERIANAL) 1 % EX OINT: 1.0000 "application " | TOPICAL_OINTMENT | CUTANEOUS | Status: DC | PRN

## 2020-10-09 MED ORDER — BENZOCAINE-MENTHOL 20-0.5 % EX AERO: 1.0000 "application " | INHALATION_SPRAY | CUTANEOUS | Status: DC | PRN

## 2020-10-09 MED ORDER — SENNOSIDES-DOCUSATE SODIUM 8.6-50 MG PO TABS
2.0000 | ORAL_TABLET | ORAL | Status: DC
  Filled 2020-10-09 (×2): qty 2

## 2020-10-09 MED ORDER — TETANUS-DIPHTH-ACELL PERTUSSIS 5-2.5-18.5 LF-MCG/0.5 IM SUSP: 0.5000 mL | Freq: Once | INTRAMUSCULAR | Status: DC

## 2020-10-09 MED ORDER — SODIUM CHLORIDE (PF) 0.9 % IJ SOLN
INTRAMUSCULAR | Status: DC | PRN
  Administered 2020-10-09: 12 mL/h via EPIDURAL

## 2020-10-09 MED ORDER — FENTANYL-BUPIVACAINE-NACL 0.5-0.125-0.9 MG/250ML-% EP SOLN
EPIDURAL | Status: AC
  Filled 2020-10-09: qty 250

## 2020-10-09 MED ORDER — FENTANYL CITRATE (PF) 100 MCG/2ML IJ SOLN: 50.0000 ug | INTRAMUSCULAR | Status: DC | PRN

## 2020-10-09 MED ORDER — LACTATED RINGERS IV SOLN: 500.0000 mL | INTRAVENOUS | Status: DC | PRN

## 2020-10-09 MED ORDER — LACTATED RINGERS IV SOLN: 500.0000 mL | Freq: Once | INTRAVENOUS | Status: DC

## 2020-10-09 MED ORDER — DIPHENHYDRAMINE HCL 50 MG/ML IJ SOLN: 12.5000 mg | INTRAMUSCULAR | Status: DC | PRN

## 2020-10-09 MED ORDER — DIPHENHYDRAMINE HCL 25 MG PO CAPS: 25.0000 mg | ORAL_CAPSULE | Freq: Four times a day (QID) | ORAL | Status: DC | PRN

## 2020-10-09 MED ORDER — ACETAMINOPHEN 325 MG PO TABS: 650.0000 mg | ORAL_TABLET | ORAL | Status: DC | PRN

## 2020-10-09 MED ORDER — OXYTOCIN BOLUS FROM INFUSION
333.0000 mL | Freq: Once | INTRAVENOUS | Status: AC
  Administered 2020-10-09: 333 mL via INTRAVENOUS

## 2020-10-09 MED ORDER — IBUPROFEN 800 MG PO TABS
800.0000 mg | ORAL_TABLET | Freq: Three times a day (TID) | ORAL | Status: DC
  Administered 2020-10-09 – 2020-10-11 (×5): 800 mg via ORAL
  Filled 2020-10-09 (×5): qty 1

## 2020-10-09 MED ORDER — SODIUM CHLORIDE 0.9 % IV SOLN: 250.0000 mL | INTRAVENOUS | Status: DC | PRN

## 2020-10-09 NOTE — Anesthesia Preprocedure Evaluation (Signed)
Anesthesia Evaluation  Patient identified by MRN, date of birth, ID band Patient awake    Reviewed: Allergy & Precautions, H&P , NPO status , Patient's Chart, lab work & pertinent test results, reviewed documented beta blocker date and time   Airway Mallampati: II  TM Distance: >3 FB Neck ROM: full    Dental no notable dental hx.    Pulmonary neg pulmonary ROS,    Pulmonary exam normal breath sounds clear to auscultation       Cardiovascular hypertension, negative cardio ROS Normal cardiovascular exam Rhythm:regular Rate:Normal     Neuro/Psych negative neurological ROS  negative psych ROS   GI/Hepatic negative GI ROS, Neg liver ROS,   Endo/Other  negative endocrine ROS  Renal/GU negative Renal ROS  negative genitourinary   Musculoskeletal   Abdominal   Peds  Hematology negative hematology ROS (+)   Anesthesia Other Findings   Reproductive/Obstetrics (+) Pregnancy                             Anesthesia Physical Anesthesia Plan  ASA: II  Anesthesia Plan: Epidural   Post-op Pain Management:    Induction:   PONV Risk Score and Plan:   Airway Management Planned:   Additional Equipment:   Intra-op Plan:   Post-operative Plan:   Informed Consent: I have reviewed the patients History and Physical, chart, labs and discussed the procedure including the risks, benefits and alternatives for the proposed anesthesia with the patient or authorized representative who has indicated his/her understanding and acceptance.       Plan Discussed with:   Anesthesia Plan Comments:         Anesthesia Quick Evaluation  

## 2020-10-09 NOTE — H&P (Signed)
34 y.o. [redacted]w[redacted]d  [id number] comes in for induction at term.  Otherwise has good fetal movement and no bleeding.  Past Medical History:  Diagnosis Date  . No pertinent past medical history   . Pregnancy induced hypertension     Past Surgical History:  Procedure Laterality Date  . NO PAST SURGERIES    . VAGINAL DELIVERY  04/2012    OB History  Gravida Para Term Preterm AB Living  4 3 3  0 0 3  SAB TAB Ectopic Multiple Live Births  0 0 0 0 3    # Outcome Date GA Lbr Len/2nd Weight Sex Delivery Anes PTL Lv  4 Current           3 Term 09/28/17 [redacted]w[redacted]d 14:25 / 00:54 3844 g M Vag-Spont EPI  LIV  2 Term 05/03/16 [redacted]w[redacted]d 10:10 / 00:24 3144 g M Vag-Spont EPI  LIV  1 Term 05/08/12 [redacted]w[redacted]d 13:15 / 01:01 2980 g F Vag-Spont EPI  LIV    Social History   Socioeconomic History  . Marital status: Married    Spouse name: Not on file  . Number of children: Not on file  . Years of education: Not on file  . Highest education level: Not on file  Occupational History  . Not on file  Tobacco Use  . Smoking status: Never Smoker  . Smokeless tobacco: Never Used  Substance and Sexual Activity  . Alcohol use: No  . Drug use: No  . Sexual activity: Not Currently    Birth control/protection: None  Other Topics Concern  . Not on file  Social History Narrative  . Not on file   Social Determinants of Health   Financial Resource Strain:   . Difficulty of Paying Living Expenses: Not on file  Food Insecurity:   . Worried About [redacted]w[redacted]d in the Last Year: Not on file  . Ran Out of Food in the Last Year: Not on file  Transportation Needs:   . Lack of Transportation (Medical): Not on file  . Lack of Transportation (Non-Medical): Not on file  Physical Activity:   . Days of Exercise per Week: Not on file  . Minutes of Exercise per Session: Not on file  Stress:   . Feeling of Stress : Not on file  Social Connections:   . Frequency of Communication with Friends and Family: Not on file  . Frequency  of Social Gatherings with Friends and Family: Not on file  . Attends Religious Services: Not on file  . Active Member of Clubs or Organizations: Not on file  . Attends Programme researcher, broadcasting/film/video Meetings: Not on file  . Marital Status: Not on file  Intimate Partner Violence:   . Fear of Current or Ex-Partner: Not on file  . Emotionally Abused: Not on file  . Physically Abused: Not on file  . Sexually Abused: Not on file   Patient has no known allergies.    Prenatal Transfer Tool  Maternal Diabetes: No Genetic Screening: Normal Maternal Ultrasounds/Referrals: Normal Fetal Ultrasounds or other Referrals:  None Maternal Substance Abuse:  No Significant Maternal Medications:  none Significant Maternal Lab Results: Group B Strep negative  Other PNC: uncomplicated.  Late at 15 weeks.    Vitals:   10/09/20 0531 10/09/20 0612 10/09/20 0631 10/09/20 0700  BP: 120/75 133/84 128/87 114/65  Pulse: 78 87 82 83  Resp: 18     Temp: 98.1 F (36.7 C)     TempSrc: Oral  Weight:      Height:        Lungs/Cor:  NAD Abdomen:  soft, gravid Ex:  no cords, erythema SVE:  0.5/30/-2 FHTs:  120s, good STV, NST R; Cat 1 tracing. Toco:  q occ   A/P   Term induction elective.  GBS neg.  Loney Laurence

## 2020-10-09 NOTE — Anesthesia Procedure Notes (Signed)
Epidural Patient location during procedure: OB Start time: 10/09/2020 9:55 AM End time: 10/09/2020 10:08 AM  Staffing Anesthesiologist: Lowella Curb, MD Performed: anesthesiologist   Preanesthetic Checklist Completed: patient identified, IV checked, site marked, risks and benefits discussed, surgical consent, monitors and equipment checked, pre-op evaluation and timeout performed  Epidural Patient position: sitting Prep: ChloraPrep Patient monitoring: heart rate, cardiac monitor, continuous pulse ox and blood pressure Approach: midline Location: L2-L3 Injection technique: LOR saline  Needle:  Needle type: Tuohy  Needle gauge: 17 G Needle length: 9 cm Needle insertion depth: 7 cm Catheter type: closed end flexible Catheter size: 20 Guage Catheter at skin depth: 11 cm Test dose: negative  Assessment Events: blood not aspirated, injection not painful, no injection resistance, no paresthesia and negative IV test  Additional Notes Epidural placed by SRNA under direct supervisionReason for block:procedure for pain

## 2020-10-10 LAB — CBC
HCT: 30.3 % — ABNORMAL LOW (ref 36.0–46.0)
Hemoglobin: 9.2 g/dL — ABNORMAL LOW (ref 12.0–15.0)
MCH: 24.7 pg — ABNORMAL LOW (ref 26.0–34.0)
MCHC: 30.4 g/dL (ref 30.0–36.0)
MCV: 81.2 fL (ref 80.0–100.0)
Platelets: 256 10*3/uL (ref 150–400)
RBC: 3.73 MIL/uL — ABNORMAL LOW (ref 3.87–5.11)
RDW: 15 % (ref 11.5–15.5)
WBC: 9.8 10*3/uL (ref 4.0–10.5)
nRBC: 0 % (ref 0.0–0.2)

## 2020-10-10 MED ORDER — RHO D IMMUNE GLOBULIN 1500 UNIT/2ML IJ SOSY
300.0000 ug | PREFILLED_SYRINGE | Freq: Once | INTRAMUSCULAR | Status: AC
  Administered 2020-10-10: 300 ug via INTRAVENOUS
  Filled 2020-10-10: qty 2

## 2020-10-10 NOTE — Anesthesia Postprocedure Evaluation (Signed)
Anesthesia Post Note  Patient: Joanne Gross  Procedure(s) Performed: AN AD HOC LABOR EPIDURAL     Patient location during evaluation: Mother Baby Anesthesia Type: Epidural Level of consciousness: awake and alert Pain management: pain level controlled Vital Signs Assessment: post-procedure vital signs reviewed and stable Respiratory status: spontaneous breathing, nonlabored ventilation and respiratory function stable Cardiovascular status: stable Postop Assessment: no headache, no backache and epidural receding Anesthetic complications: no   No complications documented.  Last Vitals:  Vitals:   10/10/20 0015 10/10/20 0526  BP: 133/78 119/71  Pulse: 74 83  Resp: 18 18  Temp: 36.9 C 36.9 C  SpO2: 100% 100%    Last Pain:  Vitals:   10/10/20 0628  TempSrc:   PainSc: 0-No pain   Pain Goal:                   Sharetha Newson

## 2020-10-10 NOTE — Progress Notes (Signed)
Post Partum Day 1 Subjective: no complaints, up ad lib, voiding, tolerating PO and + flatus  Objective: Patient Vitals for the past 24 hrs:  BP Temp Temp src Pulse Resp SpO2  10/10/20 0526 119/71 98.4 F (36.9 C) Oral 83 18 100 %  10/10/20 0015 133/78 98.4 F (36.9 C) Oral 74 18 100 %  10/09/20 2103 -- -- -- -- -- 100 %  10/09/20 2033 130/78 98.1 F (36.7 C) Oral 83 18 100 %  10/09/20 1840 137/86 98.2 F (36.8 C) Oral 72 18 100 %  10/09/20 1815 131/90 -- -- 82 -- --  10/09/20 1800 (!) 148/83 -- -- 86 -- --  10/09/20 1746 134/80 -- -- 87 18 --  10/09/20 1731 (!) 121/104 -- -- 100 16 --  10/09/20 1701 (!) 142/96 -- -- 88 -- --  10/09/20 1631 126/69 -- -- 86 -- --  10/09/20 1601 117/75 -- -- 79 18 --  10/09/20 1559 -- 98.3 F (36.8 C) Oral -- -- --  10/09/20 1530 128/79 -- -- 86 16 --  10/09/20 1501 117/66 -- -- 84 16 --  10/09/20 1401 (!) 144/89 -- -- 80 -- --  10/09/20 1350 -- 98.3 F (36.8 C) Oral -- -- --  10/09/20 1331 138/81 -- -- 82 16 --  10/09/20 1300 129/76 -- -- 70 -- --  10/09/20 1231 120/68 -- -- 69 -- --  10/09/20 1201 115/70 -- -- 78 18 --  10/09/20 1130 115/70 -- -- 76 16 --  10/09/20 1101 134/86 -- -- 77 -- --  10/09/20 1031 (!) 132/95 -- -- 81 18 --  10/09/20 1026 133/88 -- -- 83 -- --  10/09/20 1021 131/88 -- -- 78 -- --  10/09/20 1016 (!) 142/93 98.6 F (37 C) Oral 92 -- --  10/09/20 1010 (!) 150/84 -- -- 87 18 99 %  10/09/20 1002 (!) 147/99 -- -- 91 -- 98 %  10/09/20 0953 (!) 147/87 -- -- 83 -- --    Physical Exam:  General: alert, cooperative and no distress Lochia: appropriate Uterine Fundus: firm DVT Evaluation: No evidence of DVT seen on physical exam.  Recent Labs    10/09/20 0138 10/10/20 0607  WBC 9.0 9.8  HGB 10.6* 9.2*  HCT 34.7* 30.3*  PLT 316 256    No results for input(s): NA, K, CL, CO2CT, BUN, CREATININE, GLUCOSE, BILITOT, ALT, AST, ALKPHOS, PROT, ALBUMIN in the last 72 hours.  No results for input(s): CALCIUM, MG, PHOS  in the last 72 hours.  No results for input(s): PROTIME, APTT, INR in the last 72 hours.  No results for input(s): PROTIME, APTT, INR, FIBRINOGEN in the last 72 hours. Assessment/Plan: Plan for discharge tomorrow  Joanne Gross 34 y.o. [id number] PPD#1 sp SVD 1. PPC: continue routine postpartum care 2. Desires discharge home tomorrow 3. Rh neg - baby Rh pos, follow up Rhogam 4. Elevated BP: around time of delivery, has been normotensive since, continue to monitor   LOS: 1 day   [name] 10/10/2020, 9:31 AM

## 2020-10-11 LAB — RH IG WORKUP (INCLUDES ABO/RH)
ABO/RH(D): O NEG
Fetal Screen: NEGATIVE
Gestational Age(Wks): 39
Unit division: 0

## 2020-10-11 MED ORDER — IBUPROFEN 800 MG PO TABS
800.0000 mg | ORAL_TABLET | Freq: Four times a day (QID) | ORAL | 0 refills | Status: DC | PRN
Start: 2020-10-11 — End: 2022-12-12

## 2020-10-11 NOTE — Progress Notes (Signed)
Post Partum Day 2 Subjective: no complaints, up ad lib, voiding, tolerating PO.  Lochia is minimal  Objective: Patient Vitals for the past 24 hrs:  BP Temp Temp src Pulse Resp SpO2  10/11/20 0533 (!) 115/59 98.3 F (36.8 C) Oral 79 18 100 %  10/10/20 2055 133/88 98 F (36.7 C) Oral 77 18 100 %  10/10/20 1426 121/77 97.8 F (36.6 C) Oral 82 18 --    Physical Exam:  General: alert, cooperative and no distress Lochia: appropriate Uterine Fundus: firm DVT Evaluation: No evidence of DVT seen on physical exam.  Recent Labs    10/09/20 0138 10/10/20 0607  WBC 9.0 9.8  HGB 10.6* 9.2*  HCT 34.7* 30.3*  PLT 316 256    Assessment/Plan:  Greydis Stlouis Cardon 34 y.o. [id number] PPD#2 sp SVD Meeting all goals.  Discharge to home today. Rh neg - baby Rh pos, s/p Rhogam    LOS: 2 days   Rukaya Kleinschmidt GEFFEL Lukisha Procida 10/11/2020, 7:45 AM

## 2020-10-11 NOTE — Discharge Summary (Signed)
Postpartum Discharge Summary      Patient Name: Joanne Gross DOB: [birth year] MRN: [id number]  Date of admission: 10/09/2020 Delivery date:10/09/2020  Delivering provider: Carrington Clamp  Date of discharge: 10/11/2020  Admitting diagnosis: Term pregnancy [Z34.90] Intrauterine pregnancy: [redacted]w[redacted]d     Secondary diagnosis:  Active Problems:   Term pregnancy  Additional problems: none    Discharge diagnosis: Term Pregnancy Delivered                                              Post partum procedures:rhogam Augmentation: AROM and Pitocin Complications: None  Hospital course: Induction of Labor With Vaginal Delivery   34 y.o. yo [id number] at [redacted]w[redacted]d was admitted to the hospital 10/09/2020 for induction of labor.  Indication for induction: Elective.  Patient had an uncomplicated labor course as follows: Membrane Rupture Time/Date: 3:48 PM ,10/09/2020   Delivery Method:Vaginal, Spontaneous  Episiotomy: None  Lacerations:  None  Details of delivery can be found in separate delivery note.  Patient had a routine postpartum course. Patient is discharged home 10/11/20.  Newborn Data: Birth date:10/09/2020  Birth time:5:16 PM  Gender:Female  Living status:Living  Apgars:9 ,9  Weight:3541 g    Rhophylac:Yes   Physical exam  Vitals:   10/10/20 0526 10/10/20 1426 10/10/20 2055 10/11/20 0533  BP: 119/71 121/77 133/88 (!) 115/59  Pulse: 83 82 77 79  Resp: 18 18 18 18   Temp: 98.4 F (36.9 C) 97.8 F (36.6 C) 98 F (36.7 C) 98.3 F (36.8 C)  TempSrc: Oral Oral Oral Oral  SpO2: 100%  100% 100%  Weight:      Height:       General: alert, cooperative and no distress Lochia: appropriate Uterine Fundus: firm DVT Evaluation: No evidence of DVT seen on physical exam. Labs: Lab Results  Component Value Date   WBC 9.8 10/10/2020   HGB 9.2 (L) 10/10/2020   HCT 30.3 (L) 10/10/2020   MCV 81.2 10/10/2020   PLT 256 10/10/2020   CMP Latest Ref Rng & Units 09/27/2017  Glucose  65 - 99 mg/dL 90  BUN 6 - 20 mg/dL 8  Creatinine 11/27/2017 - 3.71 mg/dL 0.62  Sodium 6.94 - 854 mmol/L 134(L)  Potassium 3.5 - 5.1 mmol/L 4.2  Chloride 101 - 111 mmol/L 103  CO2 22 - 32 mmol/L 20(L)  Calcium 8.9 - 10.3 mg/dL 9.0  Total Protein 6.5 - 8.1 g/dL 7.1  Total Bilirubin 0.3 - 1.2 mg/dL 0.4  Alkaline Phos 38 - 126 U/L 311(H)  AST 15 - 41 U/L 26  ALT 14 - 54 U/L 14   Edinburgh Score: Edinburgh Postnatal Depression Scale Screening Tool 10/10/2020  I have been able to laugh and see the funny side of things. 0  I have looked forward with enjoyment to things. 0  I have blamed myself unnecessarily when things went wrong. 0  I have been anxious or worried for no good reason. 0  I have felt scared or panicky for no good reason. 0  Things have been getting on top of me. 0  I have been so unhappy that I have had difficulty sleeping. 0  I have felt sad or miserable. 0  I have been so unhappy that I have been crying. 0  The thought of harming myself has occurred to me. 0  Edinburgh Postnatal Depression Scale Total  0      After visit meds:  Allergies as of 10/11/2020   No Known Allergies     Medication List    TAKE these medications   ibuprofen 800 MG tablet Commonly known as: ADVIL Take 1 tablet (800 mg total) by mouth every 6 (six) hours as needed.   prenatal multivitamin Tabs tablet Take 1 tablet by mouth daily.        Discharge home in stable condition Infant Feeding: Breast Infant Disposition:home with mother Discharge instruction: per After Visit Summary and Postpartum booklet. Activity: Advance as tolerated. Pelvic rest for 6 weeks.  Diet: routine diet Postpartum Appointment:4 weeks  Future Appointments:No future appointments. Follow up Visit:  Follow-up Information    Carrington Clamp, MD Follow up in 4 week(s).   Specialty: Obstetrics and Gynecology Contact information: [address] [place] [address] [phone]                    10/11/2020 [facility] Lizabeth Leyden, MD

## 2021-12-22 NOTE — L&D Delivery Note (Signed)
Delivery Note Patient progressed quickly to 10 cm.  She pushed well for < 10 minutes.  At 5:07 AM a viable female was delivered via Vaginal, Spontaneous (Presentation:   Occiput Anterior).  APGAR: 9, 9; weight 7 lb 4.1 oz (3290 g).   Placenta status: Spontaneous, Intact.  Cord: 3 vessels with the following complications: None.  Cord pH: n/a  Anesthesia: Epidural Episiotomy: None Lacerations: None Suture Repair:  n/a Est. Blood Loss (mL): 100  Mom to postpartum.  Baby to Couplet care / Skin to Skin.  [facility] GEFFEL Cerra Eisenhower 12/12/2022, 6:37 AM

## 2022-05-22 LAB — OB RESULTS CONSOLE GC/CHLAMYDIA
Chlamydia: NEGATIVE
Neisseria Gonorrhea: NEGATIVE

## 2022-06-12 LAB — OB RESULTS CONSOLE HGB/HCT, BLOOD
HCT: 38 (ref 29–41)
Hemoglobin: 12.8

## 2022-06-12 LAB — OB RESULTS CONSOLE PLATELET COUNT: Platelets: 265

## 2022-06-12 LAB — OB RESULTS CONSOLE HEPATITIS B SURFACE ANTIGEN: Hepatitis B Surface Ag: NEGATIVE

## 2022-06-12 LAB — OB RESULTS CONSOLE RUBELLA ANTIBODY, IGM: Rubella: IMMUNE

## 2022-06-12 LAB — OB RESULTS CONSOLE HIV ANTIBODY (ROUTINE TESTING): HIV: NONREACTIVE

## 2022-12-04 LAB — OB RESULTS CONSOLE GBS: GBS: NEGATIVE

## 2022-12-11 ENCOUNTER — Encounter (HOSPITAL_COMMUNITY): Payer: Self-pay

## 2022-12-11 ENCOUNTER — Inpatient Hospital Stay (HOSPITAL_COMMUNITY)
Admission: RE | Admit: 2022-12-11 | Discharge: 2022-12-13 | DRG: 807 | Disposition: A | Discharge status: Home / Self Care | Payer: Medicaid Other | Attending: Obstetrics | Admitting: Obstetrics

## 2022-12-11 ENCOUNTER — Other Ambulatory Visit: Payer: Self-pay

## 2022-12-11 DIAGNOSIS — O133 Gestational [pregnancy-induced] hypertension without significant proteinuria, third trimester: Principal | ICD-10-CM | POA: Diagnosis present

## 2022-12-11 DIAGNOSIS — Z3A37 37 weeks gestation of pregnancy: Secondary | ICD-10-CM

## 2022-12-11 DIAGNOSIS — O134 Gestational [pregnancy-induced] hypertension without significant proteinuria, complicating childbirth: Secondary | ICD-10-CM | POA: Diagnosis present

## 2022-12-11 DIAGNOSIS — R030 Elevated blood-pressure reading, without diagnosis of hypertension: Secondary | ICD-10-CM | POA: Diagnosis present

## 2022-12-11 DIAGNOSIS — Z3490 Encounter for supervision of normal pregnancy, unspecified, unspecified trimester: Secondary | ICD-10-CM

## 2022-12-11 LAB — COMPREHENSIVE METABOLIC PANEL
ALT: 22 U/L (ref 0–44)
AST: 34 U/L (ref 15–41)
Albumin: 2.7 g/dL — ABNORMAL LOW (ref 3.5–5.0)
Alkaline Phosphatase: 111 U/L (ref 38–126)
Anion gap: 8 (ref 5–15)
BUN: 11 mg/dL (ref 6–20)
CO2: 20 mmol/L — ABNORMAL LOW (ref 22–32)
Calcium: 9.2 mg/dL (ref 8.9–10.3)
Chloride: 105 mmol/L (ref 98–111)
Creatinine, Ser: 0.7 mg/dL (ref 0.44–1.00)
GFR, Estimated: >60 mL/min (ref >60)
Glucose, Bld: 97 mg/dL (ref 70–99)
Potassium: 3.9 mmol/L (ref 3.5–5.1)
Sodium: 133 mmol/L — ABNORMAL LOW (ref 135–145)
Total Bilirubin: 0.5 mg/dL (ref 0.3–1.2)
Total Protein: 6.3 g/dL — ABNORMAL LOW (ref 6.5–8.1)

## 2022-12-11 LAB — CBC
HCT: 34.2 % — ABNORMAL LOW (ref 36.0–46.0)
Hemoglobin: 11.5 g/dL — ABNORMAL LOW (ref 12.0–15.0)
MCH: 27.3 pg (ref 26.0–34.0)
MCHC: 33.6 g/dL (ref 30.0–36.0)
MCV: 81 fL (ref 80.0–100.0)
Platelets: 310 10*3/uL (ref 150–400)
RBC: 4.22 MIL/uL (ref 3.87–5.11)
RDW: 14.7 % (ref 11.5–15.5)
WBC: 10.7 10*3/uL — ABNORMAL HIGH (ref 4.0–10.5)
nRBC: 0 % (ref 0.0–0.2)

## 2022-12-11 MED ORDER — EPHEDRINE 5 MG/ML INJ: 10.0000 mg | INTRAVENOUS | Status: DC | PRN

## 2022-12-11 MED ORDER — LACTATED RINGERS IV SOLN
500.0000 mL | Freq: Once | INTRAVENOUS | Status: AC
  Administered 2022-12-12: 500 mL via INTRAVENOUS

## 2022-12-11 MED ORDER — OXYTOCIN-SODIUM CHLORIDE 30-0.9 UT/500ML-% IV SOLN
2.5000 [IU]/h | INTRAVENOUS | Status: DC
  Administered 2022-12-12: 2.5 [IU]/h via INTRAVENOUS

## 2022-12-11 MED ORDER — SOD CITRATE-CITRIC ACID 500-334 MG/5ML PO SOLN: 30.0000 mL | ORAL | Status: DC | PRN

## 2022-12-11 MED ORDER — TERBUTALINE SULFATE 1 MG/ML IJ SOLN: 0.2500 mg | Freq: Once | INTRAMUSCULAR | Status: DC | PRN

## 2022-12-11 MED ORDER — ACETAMINOPHEN 325 MG PO TABS: 650.0000 mg | ORAL_TABLET | ORAL | Status: DC | PRN

## 2022-12-11 MED ORDER — LACTATED RINGERS IV SOLN: 500.0000 mL | INTRAVENOUS | Status: DC | PRN

## 2022-12-11 MED ORDER — LIDOCAINE HCL (PF) 1 % IJ SOLN: 30.0000 mL | INTRAMUSCULAR | Status: DC | PRN

## 2022-12-11 MED ORDER — OXYCODONE-ACETAMINOPHEN 5-325 MG PO TABS: 1.0000 | ORAL_TABLET | ORAL | Status: DC | PRN

## 2022-12-11 MED ORDER — ONDANSETRON HCL 4 MG/2ML IJ SOLN: 4.0000 mg | Freq: Four times a day (QID) | INTRAMUSCULAR | Status: DC | PRN

## 2022-12-11 MED ORDER — OXYTOCIN BOLUS FROM INFUSION
333.0000 mL | Freq: Once | INTRAVENOUS | Status: AC
  Administered 2022-12-12: 333 mL via INTRAVENOUS

## 2022-12-11 MED ORDER — PHENYLEPHRINE 80 MCG/ML (10ML) SYRINGE FOR IV PUSH (FOR BLOOD PRESSURE SUPPORT)
80.0000 ug | PREFILLED_SYRINGE | INTRAVENOUS | Status: DC | PRN
  Filled 2022-12-11: qty 10

## 2022-12-11 MED ORDER — DIPHENHYDRAMINE HCL 50 MG/ML IJ SOLN: 12.5000 mg | INTRAMUSCULAR | Status: DC | PRN

## 2022-12-11 MED ORDER — OXYCODONE-ACETAMINOPHEN 5-325 MG PO TABS: 2.0000 | ORAL_TABLET | ORAL | Status: DC | PRN

## 2022-12-11 MED ORDER — LACTATED RINGERS IV SOLN: INTRAVENOUS | Status: DC

## 2022-12-11 MED ORDER — OXYTOCIN-SODIUM CHLORIDE 30-0.9 UT/500ML-% IV SOLN
1.0000 m[IU]/min | INTRAVENOUS | Status: DC
  Administered 2022-12-11: 2 m[IU]/min via INTRAVENOUS
  Filled 2022-12-11: qty 500

## 2022-12-11 MED ORDER — PHENYLEPHRINE 80 MCG/ML (10ML) SYRINGE FOR IV PUSH (FOR BLOOD PRESSURE SUPPORT): 80.0000 ug | PREFILLED_SYRINGE | INTRAVENOUS | Status: DC | PRN

## 2022-12-11 MED ORDER — FENTANYL CITRATE (PF) 100 MCG/2ML IJ SOLN: 50.0000 ug | INTRAMUSCULAR | Status: DC | PRN

## 2022-12-11 MED ORDER — FENTANYL-BUPIVACAINE-NACL 0.5-0.125-0.9 MG/250ML-% EP SOLN
12.0000 mL/h | EPIDURAL | Status: DC | PRN
  Administered 2022-12-12: 12 mL/h via EPIDURAL
  Filled 2022-12-11: qty 250

## 2022-12-11 NOTE — H&P (Signed)
36 y.o. D7O2423 @ [redacted]w[redacted]d presents with newly elevated BPs.  She was seen for a routine OB visit yesterday with newly elevated BP of 140/92.  Labs were normal.  Repeat BP in the office today was again elevated in the 150/90s.  She is asymptomatic.  Given gestational age > 37 weeks, she was sent to labor and delivery for induction of labor.  Otherwise has good fetal movement and no bleeding.  Pregnancy complicated by: History of gestational hypertension with g2.  On low dose aspirin this pregnancy  Past Medical History:  Diagnosis Date   No pertinent past medical history    Pregnancy induced hypertension     Past Surgical History:  Procedure Laterality Date   NO PAST SURGERIES     VAGINAL DELIVERY  04/2012    OB History  Gravida Para Term Preterm AB Living  5 4 4  0 0 4  SAB IAB Ectopic Multiple Live Births  0 0 0 0 4    # Outcome Date GA Lbr Len/2nd Weight Sex Delivery Anes PTL Lv  5 Current           4 Term 10/09/20 [redacted]w[redacted]d 02:26 / 00:13 3541 g F Vag-Spont EPI  LIV  3 Term 09/28/17 [redacted]w[redacted]d 14:25 / 00:54 3844 g M Vag-Spont EPI  LIV  2 Term 05/03/16 [redacted]w[redacted]d 10:10 / 00:24 3144 g M Vag-Spont EPI  LIV  1 Term 05/08/12 [redacted]w[redacted]d 13:15 / 01:01 2980 g F Vag-Spont EPI  LIV    Social History   Socioeconomic History   Marital status: Married    Spouse name: Not on file   Number of children: Not on file   Years of education: Not on file   Highest education level: Not on file  Occupational History   Not on file  Tobacco Use   Smoking status: Never   Smokeless tobacco: Never  Substance and Sexual Activity   Alcohol use: No   Drug use: No   Sexual activity: Not Currently    Birth control/protection: None  Other Topics Concern   Not on file  Social History Narrative   Not on file   Social Determinants of Health   Financial Resource Strain: Not on file  Food Insecurity: No Food Insecurity (12/11/2022)   Hunger Vital Sign    Worried About Running Out of Food in the Last Year: Never true     Ran Out of Food in the Last Year: Never true  Transportation Needs: No Transportation Needs (12/11/2022)   PRAPARE - 12/13/2022 (Medical): No    Lack of Transportation (Non-Medical): No  Physical Activity: Not on file  Stress: Not on file  Social Connections: Not on file  Intimate Partner Violence: Not At Risk (12/11/2022)   Humiliation, Afraid, Rape, and Kick questionnaire    Fear of Current or Ex-Partner: No    Emotionally Abused: No    Physically Abused: No    Sexually Abused: No   Patient has no known allergies.    Prenatal Transfer Tool  Maternal Diabetes: No Genetic Screening: Normal Maternal Ultrasounds/Referrals: Normal Fetal Ultrasounds or other Referrals:  None Maternal Substance Abuse:  No Significant Maternal Medications:  None Significant Maternal Lab Results: Group B Strep negative  ABO, Rh: --/--/O NEG (12/21 2255) Antibody: POS (12/21 2255) Rubella: Immune (06/22 0000) RPR:   NR HBsAg: Negative (06/22 0000)  HIV: Non-reactive (06/22 0000)  GBS: Negative/-- (12/14 0000)       Vitals:   12/11/22  2252 12/12/22 0015  BP: (!) 141/99 (!) 141/97  Pulse: 98 79  Resp: 18 18  Temp: 98 F (36.7 C)      General:  NAD Abdomen:  soft, gravid, EFW 6.5# Ex:  no edema SVE:  4/50/-3 FHTs:  120s, moderate variability, category 1 Toco:  quiet   A/P   36 y.o. [id number] [redacted]w[redacted]d presents for induction of labor for new onset gestational hypertension at term Admit to L&D IOL: cervix favorable, will start pitocin GHTN:  Pelvis proven to 8.5#, anticipate SVD GBS negative   Joanne Gross GEFFEL Korin Setzler

## 2022-12-12 ENCOUNTER — Inpatient Hospital Stay (HOSPITAL_COMMUNITY): Payer: Medicaid Other | Admitting: Anesthesiology

## 2022-12-12 LAB — CBC
HCT: 37.8 % (ref 36.0–46.0)
Hemoglobin: 12.1 g/dL (ref 12.0–15.0)
MCH: 26.7 pg (ref 26.0–34.0)
MCHC: 32 g/dL (ref 30.0–36.0)
MCV: 83.3 fL (ref 80.0–100.0)
Platelets: 324 10*3/uL (ref 150–400)
RBC: 4.54 MIL/uL (ref 3.87–5.11)
RDW: 14.6 % (ref 11.5–15.5)
WBC: 11.8 10*3/uL — ABNORMAL HIGH (ref 4.0–10.5)
nRBC: 0 % (ref 0.0–0.2)

## 2022-12-12 LAB — TYPE AND SCREEN
ABO/RH(D): O NEG
Antibody Screen: POSITIVE

## 2022-12-12 LAB — RPR: RPR Ser Ql: NONREACTIVE

## 2022-12-12 MED ORDER — TETANUS-DIPHTH-ACELL PERTUSSIS 5-2.5-18.5 LF-MCG/0.5 IM SUSY: 0.5000 mL | PREFILLED_SYRINGE | Freq: Once | INTRAMUSCULAR | Status: DC

## 2022-12-12 MED ORDER — ONDANSETRON HCL 4 MG/2ML IJ SOLN: 4.0000 mg | INTRAMUSCULAR | Status: DC | PRN

## 2022-12-12 MED ORDER — BENZOCAINE-MENTHOL 20-0.5 % EX AERO
1.0000 | INHALATION_SPRAY | CUTANEOUS | Status: DC | PRN
  Administered 2022-12-12: 1 via TOPICAL
  Filled 2022-12-12: qty 56

## 2022-12-12 MED ORDER — DIPHENHYDRAMINE HCL 25 MG PO CAPS: 25.0000 mg | ORAL_CAPSULE | Freq: Four times a day (QID) | ORAL | Status: DC | PRN

## 2022-12-12 MED ORDER — DIBUCAINE (PERIANAL) 1 % EX OINT: 1.0000 | TOPICAL_OINTMENT | CUTANEOUS | Status: DC | PRN

## 2022-12-12 MED ORDER — SIMETHICONE 80 MG PO CHEW: 80.0000 mg | CHEWABLE_TABLET | ORAL | Status: DC | PRN

## 2022-12-12 MED ORDER — LACTATED RINGERS IV SOLN: INTRAVENOUS | Status: DC

## 2022-12-12 MED ORDER — WITCH HAZEL-GLYCERIN EX PADS: 1.0000 | MEDICATED_PAD | CUTANEOUS | Status: DC | PRN

## 2022-12-12 MED ORDER — SENNOSIDES-DOCUSATE SODIUM 8.6-50 MG PO TABS
2.0000 | ORAL_TABLET | Freq: Every day | ORAL | Status: DC
  Filled 2022-12-12: qty 2

## 2022-12-12 MED ORDER — ACETAMINOPHEN 325 MG PO TABS
650.0000 mg | ORAL_TABLET | ORAL | Status: DC | PRN
  Administered 2022-12-12: 650 mg via ORAL
  Filled 2022-12-12: qty 2

## 2022-12-12 MED ORDER — OXYCODONE HCL 5 MG PO TABS: 10.0000 mg | ORAL_TABLET | ORAL | Status: DC | PRN

## 2022-12-12 MED ORDER — COCONUT OIL OIL: 1.0000 | TOPICAL_OIL | Status: DC | PRN

## 2022-12-12 MED ORDER — IBUPROFEN 600 MG PO TABS
600.0000 mg | ORAL_TABLET | Freq: Four times a day (QID) | ORAL | Status: DC
  Administered 2022-12-12 – 2022-12-13 (×3): 600 mg via ORAL
  Filled 2022-12-12 (×5): qty 1

## 2022-12-12 MED ORDER — LACTATED RINGERS IV SOLN: 500.0000 mL | Freq: Once | INTRAVENOUS | Status: DC

## 2022-12-12 MED ORDER — PRENATAL MULTIVITAMIN CH
1.0000 | ORAL_TABLET | Freq: Every day | ORAL | Status: DC
  Administered 2022-12-12 – 2022-12-13 (×2): 1 via ORAL
  Filled 2022-12-12 (×2): qty 1

## 2022-12-12 MED ORDER — LIDOCAINE HCL (PF) 1 % IJ SOLN
INTRAMUSCULAR | Status: DC | PRN
  Administered 2022-12-12: 5 mL via EPIDURAL
  Administered 2022-12-12: 3 mL via EPIDURAL

## 2022-12-12 MED ORDER — ONDANSETRON HCL 4 MG PO TABS: 4.0000 mg | ORAL_TABLET | ORAL | Status: DC | PRN

## 2022-12-12 MED ORDER — ZOLPIDEM TARTRATE 5 MG PO TABS: 5.0000 mg | ORAL_TABLET | Freq: Every evening | ORAL | Status: DC | PRN

## 2022-12-12 MED ORDER — OXYCODONE HCL 5 MG PO TABS: 5.0000 mg | ORAL_TABLET | ORAL | Status: DC | PRN

## 2022-12-12 NOTE — Progress Notes (Signed)
Comfortable w epidural.  Feeling pressure with contractions  BP 124/76   Pulse 83   Temp 98 F (36.7 C) (Oral)   Resp 18   Ht 5\' 3"  (1.6 m)   Wt 91.3 kg   LMP 03/24/2022   SpO2 100%   BMI 35.66 kg/m   Toco: q2-4 minutes EFM:120s, moderate variability, category 1 SVE: Patient requests to defer SVE at this time  g5P4 @ [redacted]w[redacted]d with IOL for ghtn IOL: continue pitocin GHTN: labs normal on admission, BPs mild range, monitor closely Anticipate SVD

## 2022-12-12 NOTE — Progress Notes (Signed)
Post Partum Day 0 Subjective: no complaints, up ad lib, voiding, + flatus, and lochia moderate. She denies HA, CP, SOB or lightheadedness. Bonding well with baby.   Objective: Blood pressure (!) 145/95, pulse 80, temperature 97.7 F (36.5 C), resp. rate 17, height 5\' 3"  (1.6 m), weight 91.3 kg, last menstrual period 03/24/2022, SpO2 100 %, unknown if currently breastfeeding.  Physical Exam:  General: alert, cooperative, and no distress Lochia: appropriate Uterine Fundus: firm Incision: n/a DVT Evaluation: No evidence of DVT seen on physical exam.  Recent Labs    12/11/22 2255 12/12/22 0719  HGB 11.5* 12.1  HCT 34.2* 37.8    Assessment/Plan: Breastfeeding Will monitor BP; if next also elevated counseled pt will need to start on medication; likely procardia- pt agreeable Routine pp care Circumcision for baby tomorrow    LOS: 1 day   Anderia Lorenzo W Nishawn Rotan, DO 12/12/2022, 8:32 AM

## 2022-12-12 NOTE — Anesthesia Preprocedure Evaluation (Signed)
Anesthesia Evaluation  Patient identified by MRN, date of birth, ID band Patient awake    Reviewed: Allergy & Precautions, H&P , NPO status , Patient's Chart, lab work & pertinent test results  Airway Mallampati: II   Neck ROM: full    Dental   Pulmonary neg pulmonary ROS   breath sounds clear to auscultation       Cardiovascular hypertension,  Rhythm:regular Rate:Normal     Neuro/Psych    GI/Hepatic   Endo/Other    Renal/GU      Musculoskeletal   Abdominal   Peds  Hematology   Anesthesia Other Findings   Reproductive/Obstetrics (+) Pregnancy                             Anesthesia Physical Anesthesia Plan  ASA: 2  Anesthesia Plan: Epidural   Post-op Pain Management:    Induction: Intravenous  PONV Risk Score and Plan: 2 and Treatment may vary due to age or medical condition  Airway Management Planned: Natural Airway  Additional Equipment:   Intra-op Plan:   Post-operative Plan:   Informed Consent: I have reviewed the patients History and Physical, chart, labs and discussed the procedure including the risks, benefits and alternatives for the proposed anesthesia with the patient or authorized representative who has indicated his/her understanding and acceptance.     Dental advisory given  Plan Discussed with: Anesthesiologist  Anesthesia Plan Comments:        Anesthesia Quick Evaluation

## 2022-12-12 NOTE — Anesthesia Procedure Notes (Signed)
Epidural Patient location during procedure: OB Start time: 12/12/2022 12:40 AM End time: 12/12/2022 12:50 AM  Staffing Anesthesiologist: Achille Rich, MD Performed: anesthesiologist   Preanesthetic Checklist Completed: patient identified, IV checked, site marked, risks and benefits discussed, monitors and equipment checked, pre-op evaluation and timeout performed  Epidural Patient position: sitting Prep: DuraPrep Patient monitoring: heart rate, cardiac monitor, continuous pulse ox and blood pressure Approach: midline Location: L2-L3 Injection technique: LOR saline  Needle:  Needle type: Tuohy  Needle gauge: 17 G Needle length: 9 cm Needle insertion depth: 5 cm Catheter type: closed end flexible Catheter size: 19 Gauge Catheter at skin depth: 11 cm Test dose: negative and Other  Assessment Events: blood not aspirated, injection not painful, no injection resistance and negative IV test  Additional Notes Informed consent obtained prior to proceeding including risk of failure, 1% risk of PDPH, risk of minor discomfort and bruising.  Discussed rare but serious complications including epidural abscess, permanent nerve injury, epidural hematoma.  Discussed alternatives to epidural analgesia and patient desires to proceed.  Timeout performed pre-procedure verifying patient name, procedure, and platelet count.  Patient tolerated procedure well. Reason for block:procedure for pain

## 2022-12-12 NOTE — Progress Notes (Signed)
Comfortable w epidural  BP 132/79   Pulse 75   Temp 98 F (36.7 C) (Oral)   Resp 18   Ht 5\' 3"  (1.6 m)   Wt 91.3 kg   LMP 03/24/2022   SpO2 100%   BMI 35.66 kg/m   Toco: q3-5 minutes EFM:120s, moderate variability, category 1 SVE: 5/50/-2  g5P4 @ [redacted]w[redacted]d with IOL for ghtn IOL: continue pitocin GHTN: labs normal on admission, BPs mild range, monitor closely Anticipate SVD

## 2022-12-12 NOTE — Lactation Note (Signed)
This note was copied from a baby's chart. Lactation Consultation Note  Patient Name: Joanne Gross ITGPQ'D Date: 12/12/2022 Reason for consult: Initial assessment;Early term 37-38.6wks Age:34 hours  P5, Mother is experienced with breastfeeding her other children. Feed on demand with cues.  Goal 8-12+ times per day after first 24 hrs.  Place baby STS if not cueing.  Mother denies questions or concerns. Mom made aware of O/P services, breastfeeding support groups, and our phone # for post-discharge questions.    Maternal Data Has patient been taught Hand Expression?: Yes Does the patient have breastfeeding experience prior to this delivery?: Yes How long did the patient breastfeed?: 1-3 years  Feeding Mother's Current Feeding Choice: Breast Milk  Interventions Interventions: Education;LC Services brochure  Consult Status Consult Status: Follow-up Date: 12/13/22 Follow-up type: In-patient    [name] [facility] 12/12/2022, 9:52 AM

## 2022-12-13 ENCOUNTER — Encounter (HOSPITAL_COMMUNITY): Payer: Self-pay | Admitting: *Deleted

## 2022-12-13 LAB — CBC
HCT: 33.1 % — ABNORMAL LOW (ref 36.0–46.0)
Hemoglobin: 10.5 g/dL — ABNORMAL LOW (ref 12.0–15.0)
MCH: 27 pg (ref 26.0–34.0)
MCHC: 31.7 g/dL (ref 30.0–36.0)
MCV: 85.1 fL (ref 80.0–100.0)
Platelets: 253 10*3/uL (ref 150–400)
RBC: 3.89 MIL/uL (ref 3.87–5.11)
RDW: 14.9 % (ref 11.5–15.5)
WBC: 10.1 10*3/uL (ref 4.0–10.5)
nRBC: 0 % (ref 0.0–0.2)

## 2022-12-13 MED ORDER — IBUPROFEN 600 MG PO TABS: 600.0000 mg | ORAL_TABLET | Freq: Four times a day (QID) | ORAL | 0 refills | Status: AC | PRN

## 2022-12-13 MED ORDER — RHO D IMMUNE GLOBULIN 1500 UNIT/2ML IJ SOSY
300.0000 ug | PREFILLED_SYRINGE | Freq: Once | INTRAMUSCULAR | Status: AC
  Administered 2022-12-13: 300 ug via INTRAMUSCULAR
  Filled 2022-12-13: qty 2

## 2022-12-13 NOTE — Anesthesia Postprocedure Evaluation (Signed)
Anesthesia Post Note  Patient: Joanne Gross  Procedure(s) Performed: AN AD HOC LABOR EPIDURAL     Patient location during evaluation: Mother Baby Anesthesia Type: Epidural Level of consciousness: awake and alert Pain management: pain level controlled Vital Signs Assessment: post-procedure vital signs reviewed and stable Respiratory status: spontaneous breathing, nonlabored ventilation and respiratory function stable Cardiovascular status: stable Postop Assessment: no headache, no backache and epidural receding Anesthetic complications: no   No notable events documented.  Last Vitals:  Vitals:   12/13/22 0003 12/13/22 0514  BP: 134/74 (!) 116/54  Pulse: 94 80  Resp: 18 18  Temp: 36.7 C 37 C  SpO2: 97% 98%    Last Pain:  Vitals:   12/13/22 0514  TempSrc: Oral  PainSc: 0-No pain   Pain Goal:                   Salome Arnt

## 2022-12-13 NOTE — Discharge Summary (Signed)
Postpartum Discharge Summary       Patient Name: Joanne Gross DOB: 04/12/1986 MRN: [id number]  Date of admission: 12/11/2022 Delivery date:12/12/2022  Delivering provider: Marlow Baars  Date of discharge: 12/13/2022  Admitting diagnosis: Gestational hypertension w/o significant proteinuria in 3rd trimester [O13.3] Intrauterine pregnancy: [redacted]w[redacted]d     Secondary diagnosis:  Principal Problem:   Gestational hypertension w/o significant proteinuria in 3rd trimester     Discharge diagnosis: Term Pregnancy Delivered                                              Post partum procedures:rhogam Augmentation: AROM and Pitocin Complications: None  Hospital course: Induction of Labor With Vaginal Delivery   36 y.o. yo [id number] at [redacted]w[redacted]d was admitted to the hospital 12/11/2022 for induction of labor.  Indication for induction: Gestational hypertension.  Patient had an labor course complicated by nothing Membrane Rupture Time/Date: 2:06 AM ,12/12/2022   Delivery Method:Vaginal, Spontaneous  Episiotomy: None  Lacerations:  None  Details of delivery can be found in separate delivery note.  Patient had a postpartum course complicated by nothing. Patient is discharged home 12/13/22.  Newborn Data: Birth date:12/12/2022  Birth time:5:07 AM  Gender:Female  Living status:  Apgars:9 ,9  Weight:3290 g   Magnesium Sulfate received: No BMZ received: No Rhophylac:Yes Physical exam  Vitals:   12/12/22 2014 12/12/22 2057 12/13/22 0003 12/13/22 0514  BP: (!) 140/84 131/76 134/74 (!) 116/54  Pulse: 88 81 94 80  Resp: 18  18 18   Temp: 98.2 F (36.8 C)  98.1 F (36.7 C) 98.6 F (37 C)  TempSrc: Oral  Oral Oral  SpO2: 98%  97% 98%  Weight:      Height:       General: alert, cooperative, and no distress Lochia: appropriate Uterine Fundus: firm DVT Evaluation: No evidence of DVT seen on physical exam. Labs: Lab Results  Component Value Date   WBC 10.1 12/13/2022   HGB 10.5 (L)  12/13/2022   HCT 33.1 (L) 12/13/2022   MCV 85.1 12/13/2022   PLT 253 12/13/2022      Latest Ref Rng & Units 12/11/2022   10:55 PM  CMP  Glucose 70 - 99 mg/dL 97   BUN 6 - 20 mg/dL 11   Creatinine 12/13/2022 - 1.00 mg/dL 0.24   Sodium 0.97 - 353 mmol/L 133   Potassium 3.5 - 5.1 mmol/L 3.9   Chloride 98 - 111 mmol/L 105   CO2 22 - 32 mmol/L 20   Calcium 8.9 - 10.3 mg/dL 9.2   Total Protein 6.5 - 8.1 g/dL 6.3   Total Bilirubin 0.3 - 1.2 mg/dL 0.5   Alkaline Phos 38 - 126 U/L 111   AST 15 - 41 U/L 34   ALT 0 - 44 U/L 22    Edinburgh Score:    12/12/2022    7:40 AM  Edinburgh Postnatal Depression Scale Screening Tool  I have been able to laugh and see the funny side of things. 0  I have looked forward with enjoyment to things. 0  I have blamed myself unnecessarily when things went wrong. 0  I have been anxious or worried for no good reason. 0  I have felt scared or panicky for no good reason. 0  Things have been getting on top of me. 0  I have been so  unhappy that I have had difficulty sleeping. 0  I have felt sad or miserable. 0  I have been so unhappy that I have been crying. 0  The thought of harming myself has occurred to me. 0  Edinburgh Postnatal Depression Scale Total 0      After visit meds:  Allergies as of 12/13/2022   No Known Allergies      Medication List     STOP taking these medications    prenatal multivitamin Tabs tablet       TAKE these medications    ibuprofen 600 MG tablet Commonly known as: ADVIL Take 1 tablet (600 mg total) by mouth every 6 (six) hours as needed.   ibuprofen 600 MG tablet Commonly known as: ADVIL Take 1 tablet (600 mg total) by mouth every 6 (six) hours as needed.               Discharge Care Instructions  (From admission, onward)           Start     Ordered   12/13/22 0000  Discharge wound care:       Comments: For a cesarean delivery: You may wash incision with soap and water.  Do not soak or submerge  the incision for 2 weeks. Keep incision dry. You may need to keep a sanitary pad or panty liner between the incision and your clothing for comfort and to keep the incision dry. If you note drainage, increased pain, or increased redness of the incision, then please notify your physician.   12/13/22 1144   12/13/22 0000  If the dressing is still on your incision site when you go home, remove it on the third day after your surgery date. Remove dressing if it begins to fall off, or if it is dirty or damaged before the third day.       Comments: For a cesarean delivery   12/13/22 1144   12/13/22 0000  Discharge wound care:       Comments: For a cesarean delivery: You may wash incision with soap and water.  Do not soak or submerge the incision for 2 weeks. Keep incision dry. You may need to keep a sanitary pad or panty liner between the incision and your clothing for comfort and to keep the incision dry. If you note drainage, increased pain, or increased redness of the incision, then please notify your physician.   12/13/22 1249   12/13/22 0000  If the dressing is still on your incision site when you go home, remove it on the third day after your surgery date. Remove dressing if it begins to fall off, or if it is dirty or damaged before the third day.       Comments: For a cesarean delivery   12/13/22 1249             Discharge home in stable condition Infant Feeding: Breast Infant Disposition:home with mother Discharge instruction: per After Visit Summary and Postpartum booklet. Activity: Advance as tolerated. Pelvic rest for 6 weeks.  Diet: routine diet Anticipated Birth Control: Unsure Postpartum Appointment:2-3 days Future Appointments:No future appointments. Follow up Visit:  Follow-up Information     Waynard Reeds, MD Follow up on 12/16/2022.   Specialty: Obstetrics and Gynecology Why: @ 3pm for a BP Check Contact information: [address] [place]  [address] [phone]  12/13/2022 Waynard Reeds, MD

## 2022-12-14 LAB — RH IG WORKUP (INCLUDES ABO/RH)
Fetal Screen: NEGATIVE
Gestational Age(Wks): 37.4
Unit division: 0

## 2022-12-23 ENCOUNTER — Telehealth (HOSPITAL_COMMUNITY): Payer: Self-pay | Admitting: *Deleted

## 2022-12-23 NOTE — Telephone Encounter (Signed)
Mom reports feeling good. No concerns about herself at this time. EPDS=0 [facility] score=0) Mom reports baby is doing well. Feeding, peeing, and pooping without difficulty. Safe sleep reviewed. Mom reports no concerns about baby at present.  Odis Hollingshead, RN 12-23-2022 at 9:19am
